# Patient Record
Sex: Male | Born: 1990 | Race: White | Hispanic: No | Marital: Married | Smoking: Current every day smoker
Health system: Southern US, Academic
[De-identification: ages and names within clinical notes are randomized; demographics above are authoritative.]

## PROBLEM LIST (undated history)

## (undated) DIAGNOSIS — B1920 Unspecified viral hepatitis C without hepatic coma: Secondary | ICD-10-CM

## (undated) DIAGNOSIS — F419 Anxiety disorder, unspecified: Secondary | ICD-10-CM

## (undated) DIAGNOSIS — R001 Bradycardia, unspecified: Secondary | ICD-10-CM

## (undated) DIAGNOSIS — D497 Neoplasm of unspecified behavior of endocrine glands and other parts of nervous system: Secondary | ICD-10-CM

## (undated) DIAGNOSIS — F32A Depression, unspecified: Secondary | ICD-10-CM

## (undated) DIAGNOSIS — D496 Neoplasm of unspecified behavior of brain: Secondary | ICD-10-CM

## (undated) DIAGNOSIS — R519 Headache, unspecified: Secondary | ICD-10-CM

## (undated) DIAGNOSIS — R569 Unspecified convulsions: Secondary | ICD-10-CM

## (undated) DIAGNOSIS — F17200 Nicotine dependence, unspecified, uncomplicated: Secondary | ICD-10-CM

## (undated) DIAGNOSIS — F1911 Other psychoactive substance abuse, in remission: Secondary | ICD-10-CM

## (undated) HISTORY — PX: BRAIN SURGERY: SHX531

## (undated) HISTORY — DX: Unspecified convulsions: R56.9

## (undated) HISTORY — PX: HX OTHER: 2100001105

## (undated) HISTORY — DX: Bradycardia, unspecified: R00.1

## (undated) HISTORY — PX: HX APPENDECTOMY: SHX54

## (undated) HISTORY — PX: EYE SURGERY: SHX253

## (undated) HISTORY — DX: Unspecified viral hepatitis C without hepatic coma: B19.20
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
## (undated) NOTE — Discharge Instr - Diet (Signed)
Formatting of this note might be different from the original.  Regular.  Electronically signed by Claudie Fisherman, LPN at 95/62/1308 10:56 AM EDT

## (undated) NOTE — Group Note (Signed)
Formatting of this note is different from the original.    Patient Name: [name] [name]  Referring MD: No ref. provider found    Preferred Name: [place] Provider: No name on file   Patient MRN / CSN: [id number] / [id number] Patient Age/ DOB: 66 year old / [birth year]     Date: 11/04/2022  Start: 1000  End: 1100    Number of Participants: 7  Topics discussed: Community,Goals,Thought for the day,Games  Summary: daily goals,(people who aren't  happy with themselves are mean to others)win loose or draw    Level of Participation: did not attend    Diagnosis:   1. Seizure disorder (HCC)    2. Chronic GERD    3. Psychosis, unspecified psychosis type (HCC)    4. Tobacco use disorder    5. Insomnia, unspecified type        Fransico Him, 11/04/2022 11:21 AM      Electronically signed by Karie Schwalbe F at 11/04/2022 11:22 AM EDT

## (undated) NOTE — Discharge Instr - Activity (Signed)
Formatting of this note might be different from the original.  Activity as tolerated.   Electronically signed by Claudie Fisherman, LPN at 16/01/9603 10:56 AM EDT

## (undated) NOTE — Unmapped External Note (Signed)
Formatting of this note might be different from the original.    Problem: Pain  Goal: Patient's pain/discomfort is manageable  Description: Assess and monitor patient's pain using appropriate pain scale. Collaborate with interdisciplinary team and initiate plan and interventions as ordered. Re-assess patient's pain level 30 - 60 minutes after pain management intervention.   Outcome: Progressing    Problem: Safety  Goal: Patient will be injury free during hospitalization  Description: Assess and monitor vitals signs, neurological status including level of consciousness and orientation. Assess patient's risk for falls and implement fall prevention plan of care and interventions per hospital policy.     Ensure arm band on, uncluttered walking paths in room, adequate room lighting, call light and overbed table within reach, bed in low position, wheels locked, side rails up per policy, and non-skid footwear provided.   Outcome: Progressing    Problem: Daily Care  Goal: Daily care needs are met  Description: Assess and monitor ability to perform self care and identify potential discharge needs.  Outcome: Progressing    Problem: Psychosocial Needs  Goal: Demonstrates ability to cope with hospitalization/illness  Description: Assess and monitor patients ability to cope with his/her illness.  Outcome: Progressing  Goal: Collaborate with patient/family/caregiver to identify patient specific goals for this hospitalization  Outcome: Progressing    Problem: Discharge Barriers  Goal: Patient's discharge needs are met  Description: Collaborate with interdisciplinary team and initiate plans and interventions as needed.   Outcome: Progressing  Goal: Case Management- Patient's discharge needs are met  Outcome: Progressing    Problem: Risk for Elopement  Goal: The patient will not elope from the treatment program  Outcome: Progressing    Problem: Knowledge Deficit  Goal: Patient/family/caregiver demonstrates understanding of disease  process, treatment plan, medications, and discharge instructions  Description: Complete learning assessment and assess knowledge base.  Outcome: Progressing    Problem: Compromised Level of Consciousness  Goal: Level of consciousness is stable or improving  Description: Assess and monitor vital signs, neurological status to include level of consciousness, orientation level, cognition, reflexes, movement of extremities, pupil size and response to light, neuro checks as ordered, intake and output, tests (EEG), and labs.  Assess and monitor seizure activity and postictal state, if applicable.  Administer anticonvulsants and antipyretics as ordered.  Collaborate with interdisciplinary team to initiate plan and implement interventions as ordered.  Outcome: Progressing    Problem: Potential for Injury  Goal: Patient will remain free of physical injury  Description: Assess and monitor vital signs, neurological status including level of consciousness and orientation, skin, mobility, nutritional status, and labs.  Assess patient's risk for falls on admission and per hospital policy.  Collaborate with interdisciplinary team to initiate plan and implement interventions as ordered.  Outcome: Progressing    Problem: Inadequate Breathing Pattern  Goal: Patient's breathing pattern will be adequate to maintain effective ventilation  Description: Assess and monitor vital signs, respiratory status (to include depth, effort, breath sounds, and ability to cough), oxygen saturation, cyanosis, skin color, muscle tone, tests (CXR) and labs (ABGs, WBCs).  Monitor for apneic episodes, if applicable.  Administer methylxanthines, bronchodilators, steroids, antibiotics, and antipyretics as ordered.  Collaborate with respiratory therapy to administer medications and treatments.    Outcome: Progressing    Electronically signed by Alean Rinne, RN at 11/03/2022  7:56 AM EDT

## (undated) NOTE — Group Note (Signed)
Formatting of this note is different from the original.    Patient Name: [name] [name]  Referring MD: No ref. provider found    Preferred Name: [name] Provider: No name on file   Patient MRN / CSN: [id number] / [id number] Patient Age/ DOB: 21 year old / May 17, 1990     Date: 11/02/2022  Start: 1230  End: 1325    Number of Participants: 8  Topics discussed: Exercise,Music,Crafts  Summary: sittercise,music from the radio,wood crafts    Level of Participation: did not attend    Diagnosis: No diagnosis found.     Fransico Him, 11/02/2022 2:02 PM      Electronically signed by Karie Schwalbe F at 11/02/2022  2:02 PM EDT

## (undated) NOTE — Nursing Note (Signed)
Formatting of this note might be different from the original.  Patient has slept for approximately 9 hours. Patient did not report any needs during the night. Patient is currently still in bed resting calmly and quietly. No distress noted. Respiratory rate and rhythm are regular and normal. Patient encouraged to report to staff with needs or concerns.Will continue to monitor with Q 15 minute level III safety checks and report to oncoming shift.   Electronically signed by Shirlee More, RN at 11/02/2022  5:39 AM EDT

## (undated) NOTE — Nursing Note (Signed)
Formatting of this note might be different from the original.  Patient alert and oriented x4. Out in day room interacting with peers and staff appropriately. Patient has been labile at times. Patient cooperative with assessment and medication administration. Patient denies anxiety and depression. Denies SI,HI, and all hallucinations.  Contracts for safety. Denies any further needs. Will continue to monitor. Remains on level three observations. I have reviewed on fall prevention POC. Emphasized "call, don't fall" policy, use of non-skid footwear when OOB and the importance of maintaining the floor free of clutter. The patient verbalizes understanding of fall precautions. I have reviewed the following with the patient to ensure safety during the hospital stay: the importance of always wearing non-slip socks, alerting staff when assistance is needed ambulating, and keeping the floor free of clutter and spills. Pt verbalizes understanding and in agreement with plan of care. Nonskid footwear provided and encouraged patient to utilize.    Electronically signed by Adriana Simas, RN at 11/01/2022 11:54 AM EDT

## (undated) NOTE — Unmapped External Note (Signed)
Formatting of this note might be different from the original.    Problem: Pain  Goal: Patient's pain/discomfort is manageable  Description: Assess and monitor patient's pain using appropriate pain scale. Collaborate with interdisciplinary team and initiate plan and interventions as ordered. Re-assess patient's pain level 30 - 60 minutes after pain management intervention.   Outcome: Progressing    Problem: Safety  Goal: Patient will be injury free during hospitalization  Description: Assess and monitor vitals signs, neurological status including level of consciousness and orientation. Assess patient's risk for falls and implement fall prevention plan of care and interventions per hospital policy.     Ensure arm band on, uncluttered walking paths in room, adequate room lighting, call light and overbed table within reach, bed in low position, wheels locked, side rails up per policy, and non-skid footwear provided.   Outcome: Progressing    Problem: Daily Care  Goal: Daily care needs are met  Description: Assess and monitor ability to perform self care and identify potential discharge needs.  Outcome: Progressing    Problem: Psychosocial Needs  Goal: Demonstrates ability to cope with hospitalization/illness  Description: Assess and monitor patients ability to cope with his/her illness.  Outcome: Progressing  Goal: Collaborate with patient/family/caregiver to identify patient specific goals for this hospitalization  Outcome: Progressing    Problem: Discharge Barriers  Goal: Patient's discharge needs are met  Description: Collaborate with interdisciplinary team and initiate plans and interventions as needed.   Outcome: Progressing  Goal: Case Management- Patient's discharge needs are met  Outcome: Progressing    Problem: Risk for Elopement  Goal: The patient will not elope from the treatment program  Outcome: Progressing    Problem: Knowledge Deficit  Goal: Patient/family/caregiver demonstrates understanding of disease  process, treatment plan, medications, and discharge instructions  Description: Complete learning assessment and assess knowledge base.  Outcome: Progressing    Problem: Compromised Level of Consciousness  Goal: Level of consciousness is stable or improving  Description: Assess and monitor vital signs, neurological status to include level of consciousness, orientation level, cognition, reflexes, movement of extremities, pupil size and response to light, neuro checks as ordered, intake and output, tests (EEG), and labs.  Assess and monitor seizure activity and postictal state, if applicable.  Administer anticonvulsants and antipyretics as ordered.  Collaborate with interdisciplinary team to initiate plan and implement interventions as ordered.  Outcome: Progressing    Problem: Potential for Injury  Goal: Patient will remain free of physical injury  Description: Assess and monitor vital signs, neurological status including level of consciousness and orientation, skin, mobility, nutritional status, and labs.  Assess patient's risk for falls on admission and per hospital policy.  Collaborate with interdisciplinary team to initiate plan and implement interventions as ordered.  Outcome: Progressing    Problem: Inadequate Breathing Pattern  Goal: Patient's breathing pattern will be adequate to maintain effective ventilation  Description: Assess and monitor vital signs, respiratory status (to include depth, effort, breath sounds, and ability to cough), oxygen saturation, cyanosis, skin color, muscle tone, tests (CXR) and labs (ABGs, WBCs).  Monitor for apneic episodes, if applicable.  Administer methylxanthines, bronchodilators, steroids, antibiotics, and antipyretics as ordered.  Collaborate with respiratory therapy to administer medications and treatments.    Outcome: Progressing    Electronically signed by Shirlee More, RN at 10/31/2022  4:45 AM EDT
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
## (undated) NOTE — Nursing Note (Signed)
Formatting of this note might be different from the original.  Patient has slept for approximately 8 hours. Patient did not report any needs during the night. Patient is currently still in bed resting calmly and quietly. No distress noted. Respiratory rate and rhythm are regular and normal. Patient encouraged to report to staff with needs or concerns.Will continue to monitor with Q 15 minute level III safety checks and report to oncoming shift.   Electronically signed by Shirlee More, RN at 11/01/2022  6:31 AM EDT

## (undated) NOTE — Nursing Note (Signed)
Formatting of this note might be different from the original.  Patient has had no major changes this shift. Compliant with treatment. Denies SI/HI. Contracts for safety. Makes needs known. Denies any needs at this time. Remains on safety level 3 .  Electronically signed by Adriana Simas, RN at 11/01/2022  5:31 PM EDT

## (undated) NOTE — Unmapped External Note (Signed)
Formatting of this note might be different from the original.    Problem: Pain  Goal: Patient's pain/discomfort is manageable  Description: Assess and monitor patient's pain using appropriate pain scale. Collaborate with interdisciplinary team and initiate plan and interventions as ordered. Re-assess patient's pain level 30 - 60 minutes after pain management intervention.   Outcome: Progressing    Electronically signed by Christophe Louis, RN at 11/04/2022 11:18 AM EDT

## (undated) NOTE — Nursing Note (Signed)
Formatting of this note might be different from the original.  Patient has slept for approximately 9 hours. Patient did not report any needs during the night. Patient is currently in bed resting. No distress noted. Respirations are regular and unlabored. Patient encouraged to report any needs or concerns to staff. Will continue to monitor with Q 15 minute level III safety checks.  Electronically signed by Nicola Police, RN at 11/03/2022  6:00 AM EDT

## (undated) NOTE — Group Note (Signed)
Formatting of this note is different from the original.    Patient Name: [name] [name]  Referring MD: No ref. provider found    Preferred Name: [name] Provider: No name on file   Patient MRN / CSN: [id number] / [id number] Patient Age/ DOB: 59 year old / 25-Feb-1991     Date: 11/04/2022  Start: 1315  End: 1345    Number of Participants: 4  Topics discussed: Resentment  Summary: Patients defined and described resentment and its negative physical and emotional effects on the body.  Patients used the metaphor of an iceberg to identify ways that resentment manifests outwardly and the underlying feelings and triggers that may lie "beneath the surface."  Patient described situations in which they felt resentful and identified ways to cope with and process those feelings.      Level of Participation: Patient did not attend group.    Diagnosis: No diagnosis found.     Ruben Im, MSW, 11/04/2022 8:23 AM      Electronically signed by Ruben Im, MSW at 11/04/2022  8:23 AM EDT

## (undated) NOTE — Unmapped External Note (Signed)
Formatting of this note might be different from the original.    Problem: Pain  Goal: Patient's pain/discomfort is manageable  Description: Assess and monitor patient's pain using appropriate pain scale. Collaborate with interdisciplinary team and initiate plan and interventions as ordered. Re-assess patient's pain level 30 - 60 minutes after pain management intervention.   Outcome: Progressing    Electronically signed by Casimiro Needle, RN at 11/03/2022  7:38 PM EDT
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
## (undated) NOTE — Unmapped External Note (Signed)
Formatting of this note might be different from the original.    Problem: Pain  Goal: Patient's pain/discomfort is manageable  Description: Assess and monitor patient's pain using appropriate pain scale. Collaborate with interdisciplinary team and initiate plan and interventions as ordered. Re-assess patient's pain level 30 - 60 minutes after pain management intervention.   Outcome: Progressing    Problem: Safety  Goal: Patient will be injury free during hospitalization  Description: Assess and monitor vitals signs, neurological status including level of consciousness and orientation. Assess patient's risk for falls and implement fall prevention plan of care and interventions per hospital policy.     Ensure arm band on, uncluttered walking paths in room, adequate room lighting, call light and overbed table within reach, bed in low position, wheels locked, side rails up per policy, and non-skid footwear provided.   Outcome: Progressing    Problem: Daily Care  Goal: Daily care needs are met  Description: Assess and monitor ability to perform self care and identify potential discharge needs.  Outcome: Progressing    Problem: Psychosocial Needs  Goal: Demonstrates ability to cope with hospitalization/illness  Description: Assess and monitor patients ability to cope with his/her illness.  Outcome: Progressing  Goal: Collaborate with patient/family/caregiver to identify patient specific goals for this hospitalization  Outcome: Progressing    Electronically signed by Nicola Police, RN at 11/02/2022  7:14 PM EDT

## (undated) NOTE — Group Note (Signed)
Formatting of this note is different from the original.    Patient Name: [name] [name]  Referring MD: No ref. provider found    Preferred Name: [name] Provider: No name on file   Patient MRN / CSN: [id number] / [id number] Patient Age/ DOB: 39 year old / 1991-03-11     Date: 11/03/2022  Start: 1230  End: 1310    Number of Participants: 5  Topics discussed: Games  Summary: scattergories    Level of Participation: did not attend    Diagnosis: No diagnosis found.     Fransico Him, 11/03/2022 1:26 PM      Electronically signed by Karie Schwalbe F at 11/03/2022  1:26 PM EDT

## (undated) NOTE — Progress Notes (Signed)
Formatting of this note might be different from the original.  Patient admitted to [facility] has insomnia and had slept in days patient was sleeping pt dont want consult at this time.  We will check out to day shift  Electronically signed by Rodell Perna, DO at 10/31/2022  5:53 AM EDT

## (undated) NOTE — Progress Notes (Signed)
Formatting of this note might be different from the original.  Spoke with patient's mother who states  that she does not thik patient is ready for discharge she said that there has been an ncrease n his aggression and is not compliant with his treatment. My stated that the court order his TDO and if he is not cooperative with treatment he will go to jail  Electronically signed by Laury Deep, Mental Health Therapist at 11/01/2022  5:38 PM EDT

## (undated) NOTE — Nursing Note (Signed)
Formatting of this note might be different from the original.  Patient has been out pacing the hall interacting appropriately with peers this shift. Patient reports he is hoping he can go home tomorrow. 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 Patient is alert and oriented x 4. The patient denies any current emotional pain at this time. The patient denies any current SI and HI. The patient contracts for safety. Patient denies any current AVT hallucinations. The patient's affect is labile. Currently the patient is in bed resting calmly and quietly at this time. The patient rates physical pain as a 7 on a scale of 0-10 that he describes as pain due to the brain tumor he has. Patient given Tylenol 650 mg PRN and Robaxin 750 mg PRN per patient request for pain. No distress noted. Respiratory rate and rhythm normal and unlabored. Patient encouraged to express emotions and feelings to staff. Patient instructed to report to staff with questions, concerns, or needs. I have reviewed the fall prevention plan of care. Patient has been educated on the importance of keeping their room free of clutter to prevent falls. Patient encouraged to wear non-slip fall prevention socks at all times when out of bed. Patient instructed to call for assistance with ambulation if needed. Will continue to monitor with Q 15 minute level III safety checks.    Electronically signed by Shirlee More, RN at 11/01/2022  9:47 PM EDT

## (undated) NOTE — Nursing Note (Signed)
Formatting of this note might be different from the original.  Patient alert and oriented x3. Not oriented to time. Isolative to room interactions with staff appropriate. Labile mood and affect noted. Patient cooperative with assessment and medication administration. Denies SI,HI, and all hallucinations. Contracts for safety. Patient reports he knows that he has delusional and paranoid thoughts. Denies any further needs. Will continue to monitor. Remains on level three observations.  I have reviewed on fall prevention POC. Emphasized "call, don't fall" policy, use of non-skid footwear when OOB and the importance of maintaining the floor free of clutter. The patient verbalizes understanding of fall precautions. I have reviewed the following with the patient to ensure safety during the hospital stay: the importance of always wearing non-slip socks, alerting staff when assistance is needed ambulating, and keeping the floor free of clutter and spills. Pt verbalizes understanding and in agreement with plan of care. Nonskid footwear provided and encouraged patient to utilize.   Electronically signed by Adriana Simas, RN at 10/31/2022  1:04 PM EDT

## (undated) NOTE — Nursing Note (Signed)
Formatting of this note might be different from the original.  Patient has slept for approximately 3 hours since admission. Patient did not report any needs during the night after going to bed. Patient is currently still in bed resting calmly and quietly. No distress noted. Respiratory rate and rhythm are regular and normal. Patient encouraged to report to staff with needs or concerns.Will continue to monitor with Q 15 minute level III safety checks and report to oncoming shift.   Electronically signed by Shirlee More, RN at 10/31/2022  5:55 AM EDT

## (undated) NOTE — Nursing Note (Signed)
Formatting of this note might be different from the original.  Up for am medications.  Affect and mood appropriate.  Denies any depression, anxiety or SI.  Hopes to go home past TDO hearing. Slept well. Encouraged to wear non slip socks to prevent falls. Level 3 safety checks continue.   Electronically signed by Christophe Louis, RN at 11/02/2022  7:35 AM EDT

## (undated) NOTE — Nursing Note (Signed)
Formatting of this note might be different from the original.  GNFA:[id number]  X/B:[id number]  EXP:10/2024  [id number]     UZEDY 200MG /0.56ML given in left subcutaneous tissue. Pt tolerated well. Will continue to monitor.  Electronically signed by Adriana Simas, RN at 11/01/2022  1:19 PM EDT

## (undated) NOTE — Group Note (Signed)
Formatting of this note is different from the original.    Patient Name: [name] [name]  Referring MD: No ref. provider found    Preferred Name: [name] Provider: No name on file   Patient MRN / CSN: [id number] / [id number] Patient Age/ DOB: 55 year old / 09/15/1990     Date: 11/03/2022  Start: 0930  End: 1025    Number of Participants: 6  Topics discussed: Community,Goals,Thought for the day,Mind over matter,Self awareness  Summary: daily goals,(one of the happiest moments in life is when you find the courage to let go of what you can't change)trick questions,positive comments from peers    Level of Participation: active  Quality of Participation: attentive  Interactions with Others: gave feedback  Mood/Affect: appropriate  Triggers (if applicable):   Cognition: coherent/clear  Progress: Minimal  Response: goal"go home eat good"  Plan: patient will be encouraged to attend and participate in daily activity groups     Diagnosis: No diagnosis found.     Fransico Him, 11/03/2022 10:56 AM      Electronically signed by Karie Schwalbe F at 11/03/2022 10:57 AM EDT

## (undated) NOTE — Nursing Note (Signed)
Formatting of this note might be different from the original.  Pt is isolating to self this evening, no noted distress  Electronically signed by Joaquim Nam, RN at 11/02/2022  5:57 PM EDT

## (undated) NOTE — Progress Notes (Signed)
Formatting of this note might be different from the original.  Reviewed therapy notes.   Cherub Gaylyn Rong, Mental Health Therapist  LCSW  Electronically signed by Selinda Eon, Mental Health Therapist at 11/04/2022 11:10 AM EDT

## (undated) NOTE — Unmapped External Note (Signed)
Formatting of this note might be different from the original.    Problem: Pain  Goal: Patient's pain/discomfort is manageable  Description: Assess and monitor patient's pain using appropriate pain scale. Collaborate with interdisciplinary team and initiate plan and interventions as ordered. Re-assess patient's pain level 30 - 60 minutes after pain management intervention.   Outcome: Progressing    Problem: Safety  Goal: Patient will be injury free during hospitalization  Description: Assess and monitor vitals signs, neurological status including level of consciousness and orientation. Assess patient's risk for falls and implement fall prevention plan of care and interventions per hospital policy.     Ensure arm band on, uncluttered walking paths in room, adequate room lighting, call light and overbed table within reach, bed in low position, wheels locked, side rails up per policy, and non-skid footwear provided.   Outcome: Progressing    Problem: Daily Care  Goal: Daily care needs are met  Description: Assess and monitor ability to perform self care and identify potential discharge needs.  Outcome: Progressing    Problem: Psychosocial Needs  Goal: Demonstrates ability to cope with hospitalization/illness  Description: Assess and monitor patients ability to cope with his/her illness.  Outcome: Progressing  Goal: Collaborate with patient/family/caregiver to identify patient specific goals for this hospitalization  Outcome: Progressing    Problem: Discharge Barriers  Goal: Patient's discharge needs are met  Description: Collaborate with interdisciplinary team and initiate plans and interventions as needed.   Outcome: Progressing  Goal: Case Management- Patient's discharge needs are met  Outcome: Progressing    Problem: Risk for Elopement  Goal: The patient will not elope from the treatment program  Outcome: Progressing    Problem: Knowledge Deficit  Goal: Patient/family/caregiver demonstrates understanding of disease  process, treatment plan, medications, and discharge instructions  Description: Complete learning assessment and assess knowledge base.  Outcome: Progressing    Problem: Compromised Level of Consciousness  Goal: Level of consciousness is stable or improving  Description: Assess and monitor vital signs, neurological status to include level of consciousness, orientation level, cognition, reflexes, movement of extremities, pupil size and response to light, neuro checks as ordered, intake and output, tests (EEG), and labs.  Assess and monitor seizure activity and postictal state, if applicable.  Administer anticonvulsants and antipyretics as ordered.  Collaborate with interdisciplinary team to initiate plan and implement interventions as ordered.  Outcome: Progressing    Problem: Potential for Injury  Goal: Patient will remain free of physical injury  Description: Assess and monitor vital signs, neurological status including level of consciousness and orientation, skin, mobility, nutritional status, and labs.  Assess patient's risk for falls on admission and per hospital policy.  Collaborate with interdisciplinary team to initiate plan and implement interventions as ordered.  Outcome: Progressing    Problem: Inadequate Breathing Pattern  Goal: Patient's breathing pattern will be adequate to maintain effective ventilation  Description: Assess and monitor vital signs, respiratory status (to include depth, effort, breath sounds, and ability to cough), oxygen saturation, cyanosis, skin color, muscle tone, tests (CXR) and labs (ABGs, WBCs).  Monitor for apneic episodes, if applicable.  Administer methylxanthines, bronchodilators, steroids, antibiotics, and antipyretics as ordered.  Collaborate with respiratory therapy to administer medications and treatments.    Outcome: Progressing    Electronically signed by Shirlee More, RN at 11/01/2022  9:48 PM EDT

## (undated) NOTE — Nursing Note (Signed)
Formatting of this note might be different from the original.  Patient is alert and oriented x 4. Mood is calm and cooperative; affect is appropriate. He denies having any nightmares last night but states that he did not sleep very good. He denies having any physical pain. He denies any SI, HI, or A,V,T hallucinations. He denies any anxiety or depression. He contracts for safety and remains a level III. He will continue to be monitored every 15 minutes for safety.  Electronically signed by Alean Rinne, RN at 11/03/2022  9:10 AM EDT

## (undated) NOTE — Unmapped External Note (Signed)
Formatting of this note might be different from the original.    Problem: Pain  Goal: Patient's pain/discomfort is manageable  Description: Assess and monitor patient's pain using appropriate pain scale. Collaborate with interdisciplinary team and initiate plan and interventions as ordered. Re-assess patient's pain level 30 - 60 minutes after pain management intervention.   Outcome: Progressing    Problem: Safety  Goal: Patient will be injury free during hospitalization  Description: Assess and monitor vitals signs, neurological status including level of consciousness and orientation. Assess patient's risk for falls and implement fall prevention plan of care and interventions per hospital policy.     Ensure arm band on, uncluttered walking paths in room, adequate room lighting, call light and overbed table within reach, bed in low position, wheels locked, side rails up per policy, and non-skid footwear provided.   Outcome: Progressing    Problem: Daily Care  Goal: Daily care needs are met  Description: Assess and monitor ability to perform self care and identify potential discharge needs.  Outcome: Progressing    Problem: Psychosocial Needs  Goal: Demonstrates ability to cope with hospitalization/illness  Description: Assess and monitor patients ability to cope with his/her illness.  Outcome: Progressing  Goal: Collaborate with patient/family/caregiver to identify patient specific goals for this hospitalization  Outcome: Progressing    Problem: Discharge Barriers  Goal: Patient's discharge needs are met  Description: Collaborate with interdisciplinary team and initiate plans and interventions as needed.   Outcome: Progressing  Goal: Case Management- Patient's discharge needs are met  Outcome: Progressing    Problem: Risk for Elopement  Goal: The patient will not elope from the treatment program  Outcome: Progressing    Problem: Knowledge Deficit  Goal: Patient/family/caregiver demonstrates understanding of disease  process, treatment plan, medications, and discharge instructions  Description: Complete learning assessment and assess knowledge base.  Outcome: Progressing    Problem: Compromised Level of Consciousness  Goal: Level of consciousness is stable or improving  Description: Assess and monitor vital signs, neurological status to include level of consciousness, orientation level, cognition, reflexes, movement of extremities, pupil size and response to light, neuro checks as ordered, intake and output, tests (EEG), and labs.  Assess and monitor seizure activity and postictal state, if applicable.  Administer anticonvulsants and antipyretics as ordered.  Collaborate with interdisciplinary team to initiate plan and implement interventions as ordered.  Outcome: Progressing    Problem: Potential for Injury  Goal: Patient will remain free of physical injury  Description: Assess and monitor vital signs, neurological status including level of consciousness and orientation, skin, mobility, nutritional status, and labs.  Assess patient's risk for falls on admission and per hospital policy.  Collaborate with interdisciplinary team to initiate plan and implement interventions as ordered.  Outcome: Progressing    Problem: Inadequate Breathing Pattern  Goal: Patient's breathing pattern will be adequate to maintain effective ventilation  Description: Assess and monitor vital signs, respiratory status (to include depth, effort, breath sounds, and ability to cough), oxygen saturation, cyanosis, skin color, muscle tone, tests (CXR) and labs (ABGs, WBCs).  Monitor for apneic episodes, if applicable.  Administer methylxanthines, bronchodilators, steroids, antibiotics, and antipyretics as ordered.  Collaborate with respiratory therapy to administer medications and treatments.    Outcome: Progressing    Electronically signed by Shirlee More, RN at 10/31/2022 10:17 PM EDT

## (undated) NOTE — Group Note (Signed)
Formatting of this note is different from the original.    Patient Name: [name] [name]  Referring MD: No ref. provider found    Preferred Name: [name] Provider: No name on file   Patient MRN / CSN: [id number] / [id number] Patient Age/ DOB: 54 year old / 26-Jul-1990     Date: 11/02/2022  Start: 0915  End: 1005    Number of Participants: 11  Topics discussed: Community,Goals,Thought for the day,Table poetry,Games  Summary: daily goals,(the sign of a beautiful person is that they always see beauty in others)wooden block words,hedbanz    Level of Participation: did not attend    Diagnosis: No diagnosis found.     Fransico Him, 11/02/2022 10:59 AM      Electronically signed by Karie Schwalbe F at 11/02/2022 10:59 AM EDT

## (undated) NOTE — Progress Notes (Signed)
Formatting of this note might be different from the original.  Reviewed therapy notes and treatment plan.  Cherub Gaylyn Rong, Mental Health Therapist  LCSW  Electronically signed by Selinda Eon, Mental Health Therapist at 11/03/2022  1:55 PM EDT

## (undated) NOTE — Group Note (Signed)
Formatting of this note is different from the original.    Patient Name: [name]  Referring MD: No ref. provider found    Preferred Name: Brylen Provider: No name on file   Patient MRN / CSN: [id number] / [id number] Patient Age/ DOB: 36 year old / [birth year]     Date: 11/03/2022  Start: 1350  End: 1430    Number of Participants: 5  Topics discussed: Emotions  Summary: Participants discussed their emotions and how they can handle them.    Level of Participation: did not attend  Quality of Participation:   Interactions with Others:   Mood/Affect:   Triggers (if applicable):   Cognition:   Progress:   Response:   Plan:     Diagnosis: No diagnosis found.     Leeanne Deed, 11/04/2022 8:23 AM      Electronically signed by Alanson Puls B at 11/04/2022  8:23 AM EDT

## (undated) NOTE — Unmapped External Note (Signed)
Formatting of this note might be different from the original.    Problem: Pain  Goal: Patient's pain/discomfort is manageable  Description: Assess and monitor patient's pain using appropriate pain scale. Collaborate with interdisciplinary team and initiate plan and interventions as ordered. Re-assess patient's pain level 30 - 60 minutes after pain management intervention.   Outcome: Progressing    Electronically signed by Christophe Louis, RN at 11/02/2022  7:14 AM EDT

## (undated) NOTE — Nursing Note (Signed)
Formatting of this note might be different from the original.  Patient has had no major changes this shift. Compliant with treatment. Remains delusional,labile, grandiose, and paranoid. Denies SI/HI. Contracts for safety. Makes needs known. Denies any needs at this time. Remains on safety level 3 .  Electronically signed by Adriana Simas, RN at 10/31/2022  5:06 PM EDT

## (undated) NOTE — Unmapped External Note (Signed)
Formatting of this note might be different from the original.    Problem: Pain  Goal: Patient's pain/discomfort is manageable  Description: Assess and monitor patient's pain using appropriate pain scale. Collaborate with interdisciplinary team and initiate plan and interventions as ordered. Re-assess patient's pain level 30 - 60 minutes after pain management intervention.   Outcome: Progressing    Problem: Safety  Goal: Patient will be injury free during hospitalization  Description: Assess and monitor vitals signs, neurological status including level of consciousness and orientation. Assess patient's risk for falls and implement fall prevention plan of care and interventions per hospital policy.     Ensure arm band on, uncluttered walking paths in room, adequate room lighting, call light and overbed table within reach, bed in low position, wheels locked, side rails up per policy, and non-skid footwear provided.   Outcome: Progressing    Problem: Daily Care  Goal: Daily care needs are met  Description: Assess and monitor ability to perform self care and identify potential discharge needs.  Outcome: Progressing    Problem: Psychosocial Needs  Goal: Demonstrates ability to cope with hospitalization/illness  Description: Assess and monitor patients ability to cope with his/her illness.  Outcome: Progressing    Problem: Risk for Elopement  Goal: The patient will not elope from the treatment program  Outcome: Progressing    Problem: Knowledge Deficit  Goal: Patient/family/caregiver demonstrates understanding of disease process, treatment plan, medications, and discharge instructions  Description: Complete learning assessment and assess knowledge base.  Outcome: Progressing    Problem: Compromised Level of Consciousness  Goal: Level of consciousness is stable or improving  Description: Assess and monitor vital signs, neurological status to include level of consciousness, orientation level, cognition, reflexes, movement of  extremities, pupil size and response to light, neuro checks as ordered, intake and output, tests (EEG), and labs.  Assess and monitor seizure activity and postictal state, if applicable.  Administer anticonvulsants and antipyretics as ordered.  Collaborate with interdisciplinary team to initiate plan and implement interventions as ordered.  Outcome: Progressing    Problem: Potential for Injury  Goal: Patient will remain free of physical injury  Description: Assess and monitor vital signs, neurological status including level of consciousness and orientation, skin, mobility, nutritional status, and labs.  Assess patient's risk for falls on admission and per hospital policy.  Collaborate with interdisciplinary team to initiate plan and implement interventions as ordered.  Outcome: Progressing    Problem: Inadequate Breathing Pattern  Goal: Patient's breathing pattern will be adequate to maintain effective ventilation  Description: Assess and monitor vital signs, respiratory status (to include depth, effort, breath sounds, and ability to cough), oxygen saturation, cyanosis, skin color, muscle tone, tests (CXR) and labs (ABGs, WBCs).  Monitor for apneic episodes, if applicable.  Administer methylxanthines, bronchodilators, steroids, antibiotics, and antipyretics as ordered.  Collaborate with respiratory therapy to administer medications and treatments.    Outcome: Progressing    Electronically signed by Adriana Simas, RN at 10/31/2022 11:59 AM EDT

## (undated) NOTE — Group Note (Signed)
Formatting of this note is different from the original.    Patient Name: [name] [name]  Referring MD: No ref. provider found    Preferred Name: [name] Provider: No name on file   Patient MRN / CSN: [id number] / [id number] Patient Age/ DOB: 80 year old / 30-Dec-1990     Date: 11/02/2022  Start: 1105  End: 1145    Number of Participants: 9  Topics discussed: [employer]s and Self-Soothing  Summary: Participants took part in a group discussion about coping skills and self-soothing.    Level of Participation: active  Quality of Participation: cooperative  Interactions with Others: gave feedback  Mood/Affect: appropriate  Triggers (if applicable):   Cognition: coherent/clear  Progress: Moderate  Response:   Plan: patient will be encouraged to attend groups daily    Diagnosis: No diagnosis found.     Leeanne Deed, 11/02/2022 11:59 AM      Electronically signed by Alanson Puls B at 11/02/2022 11:59 AM EDT

## (undated) NOTE — Group Note (Signed)
Formatting of this note is different from the original.    Patient Name: [name] [name]  Referring MD: No ref. provider found    Preferred Name: Pilar Provider: No name on file   Patient MRN / CSN: [id number] / [id number] Patient Age/ DOB: 71 year old / July 17, 1990     Date: 10/31/2022  Start: 1300  End: 1400    Number of Participants: 7  Topics discussed: sel discovery    Summary: patient's participate in activity discussing different aspects of self awarness     Ptient did not attend group  Diagnosis: No diagnosis found.     Laury Deep, Mental Health Therapist, 10/31/2022 5:43 PM      Electronically signed by Laury Deep, Mental Health Therapist at 10/31/2022  5:44 PM EDT

## (undated) NOTE — Unmapped External Note (Signed)
Formatting of this note might be different from the original.    Problem: Pain  Goal: Patient's pain/discomfort is manageable  Description: Assess and monitor patient's pain using appropriate pain scale. Collaborate with interdisciplinary team and initiate plan and interventions as ordered. Re-assess patient's pain level 30 - 60 minutes after pain management intervention.   Outcome: Progressing    Problem: Safety  Goal: Patient will be injury free during hospitalization  Description: Assess and monitor vitals signs, neurological status including level of consciousness and orientation. Assess patient's risk for falls and implement fall prevention plan of care and interventions per hospital policy.     Ensure arm band on, uncluttered walking paths in room, adequate room lighting, call light and overbed table within reach, bed in low position, wheels locked, side rails up per policy, and non-skid footwear provided.   Outcome: Progressing    Problem: Daily Care  Goal: Daily care needs are met  Description: Assess and monitor ability to perform self care and identify potential discharge needs.  Outcome: Progressing    Problem: Psychosocial Needs  Goal: Demonstrates ability to cope with hospitalization/illness  Description: Assess and monitor patients ability to cope with his/her illness.  Outcome: Progressing  Goal: Collaborate with patient/family/caregiver to identify patient specific goals for this hospitalization  Outcome: Progressing    Problem: Risk for Elopement  Goal: The patient will not elope from the treatment program  Outcome: Progressing    Problem: Knowledge Deficit  Goal: Patient/family/caregiver demonstrates understanding of disease process, treatment plan, medications, and discharge instructions  Description: Complete learning assessment and assess knowledge base.  Outcome: Progressing    Problem: Compromised Level of Consciousness  Goal: Level of consciousness is stable or improving  Description: Assess  and monitor vital signs, neurological status to include level of consciousness, orientation level, cognition, reflexes, movement of extremities, pupil size and response to light, neuro checks as ordered, intake and output, tests (EEG), and labs.  Assess and monitor seizure activity and postictal state, if applicable.  Administer anticonvulsants and antipyretics as ordered.  Collaborate with interdisciplinary team to initiate plan and implement interventions as ordered.  Outcome: Progressing    Problem: Potential for Injury  Goal: Patient will remain free of physical injury  Description: Assess and monitor vital signs, neurological status including level of consciousness and orientation, skin, mobility, nutritional status, and labs.  Assess patient's risk for falls on admission and per hospital policy.  Collaborate with interdisciplinary team to initiate plan and implement interventions as ordered.  Outcome: Progressing    Problem: Inadequate Breathing Pattern  Goal: Patient's breathing pattern will be adequate to maintain effective ventilation  Description: Assess and monitor vital signs, respiratory status (to include depth, effort, breath sounds, and ability to cough), oxygen saturation, cyanosis, skin color, muscle tone, tests (CXR) and labs (ABGs, WBCs).  Monitor for apneic episodes, if applicable.  Administer methylxanthines, bronchodilators, steroids, antibiotics, and antipyretics as ordered.  Collaborate with respiratory therapy to administer medications and treatments.    Outcome: Progressing    Electronically signed by Adriana Simas, RN at 11/01/2022 11:09 AM EDT

## (undated) NOTE — Nursing Note (Signed)
Formatting of this note might be different from the original.  Notes reviewed.  Agree with assessments  Electronically signed by Christophe Louis, RN at 11/04/2022 11:37 AM EDT

## (undated) NOTE — Group Note (Signed)
Formatting of this note is different from the original.    Patient Name: [name]  Referring MD: No ref. provider found    Preferred Name: [name] [name] Provider: No name on file   Patient MRN / CSN: [id number] / [id number] Patient Age/ DOB: 8 year old / 1990/10/08     Date: 11/02/2022  Start: 1400  End: 1440    Number of Participants: 9  Topics discussed: Change  Summary: Ptient's viewed video "[employer]" patient shared what they gathered from the video and what you need to help wwith change. Patient's discussed what they hd learned that will be useful to help them change     Patient did not attend group    Diagnosis: No diagnosis found.     Laury Deep, Mental Health Therapist, 11/02/2022 5:04 PM      Electronically signed by Laury Deep, Mental Health Therapist at 11/02/2022  5:05 PM EDT

## (undated) NOTE — Nursing Note (Signed)
Formatting of this note might be different from the original.  Medicated with Benadryl 25mg  PO for complaints of allergies.  Electronically signed by Christophe Louis, RN at 11/03/2022  6:16 PM EDT

## (undated) NOTE — Nursing Note (Signed)
Formatting of this note might be different from the original.  Patient isolates to himself. Patient reported he did not want to go to snack with others this shift. Patient is cooperative with assessment and medication administration. Patient denies any SI/HI/AVT hallucinations. Patient denies anxiety and depression. Denies any physical pain. Contracts for safety. Patient instructed to alert staff when assistance is needed. Emphasized the "Call, don't fall" policy, and the importance of wearing nonskid footwear when OOB. Patient verbalized understanding. Floor is free of clutter/spills. Patient reports no needs at this time. Level III q 15 minute safety checks continue.   Electronically signed by Nicola Police, RN at 11/02/2022 10:14 PM EDT

## (undated) NOTE — Nursing Note (Signed)
Formatting of this note might be different from the original.  Client has rested quietly this hs for 9 hours. Continue level 3 q 15 minute safety checks.   Electronically signed by Casimiro Needle, RN at 11/04/2022  6:09 AM EDT

## (undated) NOTE — Nursing Note (Signed)
Formatting of this note might be different from the original.  Patient has been out on the unit with peers pacing the hall and socializing appropriately this shift. Patient remains delusional and tangential with a disorganized thought process and told Hospitalist that he has 2 urethras this evening during H&P. Patient is alert and oriented x 4. The patient denies any current emotional pain att his time. Patient continues to minimize symptoms and fixates on going home Monday. The patient denies any current SI and HI. The patient contracts for safety. Patient denies any current AVT hallucinations. The patient's affect is labile. Currently the patient is in bed trying to rest calmly and quietly at this time. Patient was given Atarax 25 mg PRN for complaint of continued insomnia after Trazodone 50 mg was not effective. The patient rates physical pain as a 5 on a scale of 0-10 in his head on the right side. Patient given Tylenol 650 mg PRN along with Robaxin 750 mg PRN for complaint of pain. No distress noted. Respiratory rate and rhythm normal and unlabored. Patient encouraged to express emotions and feelings to staff. Patient instructed to report to staff with questions, concerns, or needs. I have reviewed the fall prevention plan of care. Patient has been educated on the importance of keeping their room free of clutter to prevent falls. Patient encouraged to wear non-slip fall prevention socks at all times when out of bed. Patient instructed to call for assistance with ambulation if needed. Will continue to monitor with Q 15 minute level III safety checks.    Electronically signed by Shirlee More, RN at 10/31/2022 10:23 PM EDT

## (undated) NOTE — Nursing Note (Signed)
Formatting of this note might be different from the original.  Client has been out of his room this pm watching TV and talking to peers. Client denies suicidal / homicidal ideations, denies a/v hallucinations. Client rates anxiety at 0, depression at 0. Client complaining of a headache rates pain at 4. Client received tylenol 650 mg po for pain. Continue level 3 q 15 minute safety checks.  Electronically signed by Casimiro Needle, RN at 11/03/2022  9:28 PM EDT

---
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
## 2017-06-15 ENCOUNTER — Ambulatory Visit (HOSPITAL_COMMUNITY): Payer: Self-pay

## 2018-01-17 DIAGNOSIS — B1920 Unspecified viral hepatitis C without hepatic coma: Secondary | ICD-10-CM | POA: Insufficient documentation

## 2019-05-15 ENCOUNTER — Encounter (INDEPENDENT_AMBULATORY_CARE_PROVIDER_SITE_OTHER): Payer: Self-pay | Admitting: Neurology

## 2019-08-31 ENCOUNTER — Ambulatory Visit (INDEPENDENT_AMBULATORY_CARE_PROVIDER_SITE_OTHER): Payer: Self-pay

## 2019-08-31 NOTE — Telephone Encounter (Addendum)
-----   Message from Burr Medico sent at 08/29/2019  3:55 PM EDT -----  Pt is calling saying he needs to be seen for seizures and lesions , seizures is for Neurology but there is a referrral for Neurosurgery. I don't know which docotor i can put him with , please give him a call back to get scheduled.   =================================================  Left a message for pt to call back to confirm appointment.  Hamilton Capri, RN  08/31/2019, 16:38

## 2019-09-05 ENCOUNTER — Ambulatory Visit (INDEPENDENT_AMBULATORY_CARE_PROVIDER_SITE_OTHER): Payer: Self-pay | Admitting: Anesthesiology

## 2019-10-17 ENCOUNTER — Other Ambulatory Visit: Payer: Self-pay

## 2019-10-17 ENCOUNTER — Ambulatory Visit: Payer: MEDICAID | Attending: PHYSICIAN ASSISTANT | Admitting: Anesthesiology

## 2019-10-17 ENCOUNTER — Encounter (INDEPENDENT_AMBULATORY_CARE_PROVIDER_SITE_OTHER): Payer: Self-pay | Admitting: Anesthesiology

## 2019-10-17 VITALS — BP 130/78 | HR 60 | Temp 98.7°F | Ht 72.0 in | Wt 166.2 lb

## 2019-10-17 DIAGNOSIS — R569 Unspecified convulsions: Secondary | ICD-10-CM | POA: Insufficient documentation

## 2019-10-17 DIAGNOSIS — G40909 Epilepsy, unspecified, not intractable, without status epilepticus: Secondary | ICD-10-CM

## 2019-10-17 DIAGNOSIS — R930 Abnormal findings on diagnostic imaging of skull and head, not elsewhere classified: Secondary | ICD-10-CM | POA: Insufficient documentation

## 2019-10-17 DIAGNOSIS — R519 Headache, unspecified: Secondary | ICD-10-CM | POA: Insufficient documentation

## 2019-10-17 NOTE — Progress Notes (Signed)
I personally saw and evaluated the patient. See mid-level's note for additional details. My findings/participation are : Pt is a 29 yr old male who had a sudden onset of generalized tonic-clonic seizures in January this year. He has no aura. His wife has not noticed any complex partial seizure like activity. He has had 11-12 seizures since January. He was started on Keppra, but experienced dizziness, mood changes, generalized weakness, nausea and vomiting and stopped this medication. He had a CT head that showed a R temporal hypodense area. I will see him back in three weeks with a MRI brain. 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 I also asked him to talk to his neurologists to get an alternate anti-seizure medication.     Debbora Dus, MD
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
## 2019-11-07 ENCOUNTER — Encounter (INDEPENDENT_AMBULATORY_CARE_PROVIDER_SITE_OTHER): Payer: Self-pay | Admitting: Neurology

## 2019-11-07 ENCOUNTER — Ambulatory Visit (HOSPITAL_BASED_OUTPATIENT_CLINIC_OR_DEPARTMENT_OTHER): Payer: MEDICAID | Admitting: Neurology

## 2019-11-07 ENCOUNTER — Other Ambulatory Visit: Payer: Self-pay

## 2019-11-07 ENCOUNTER — Ambulatory Visit
Admission: RE | Admit: 2019-11-07 | Discharge: 2019-11-07 | Disposition: A | Discharge status: Home / Self Care | Payer: MEDICAID | Source: Ambulatory Visit | Attending: PHYSICIAN ASSISTANT | Admitting: PHYSICIAN ASSISTANT

## 2019-11-07 ENCOUNTER — Encounter (INDEPENDENT_AMBULATORY_CARE_PROVIDER_SITE_OTHER): Payer: Self-pay | Admitting: Anesthesiology

## 2019-11-07 ENCOUNTER — Ambulatory Visit (HOSPITAL_BASED_OUTPATIENT_CLINIC_OR_DEPARTMENT_OTHER): Payer: MEDICAID | Admitting: Anesthesiology

## 2019-11-07 VITALS — BP 140/82 | HR 53 | Ht 72.0 in | Wt 172.0 lb

## 2019-11-07 VITALS — BP 140/82 | HR 53 | Temp 97.8°F | Ht 72.05 in | Wt 172.0 lb

## 2019-11-07 DIAGNOSIS — G930 Cerebral cysts: Secondary | ICD-10-CM

## 2019-11-07 DIAGNOSIS — G939 Disorder of brain, unspecified: Secondary | ICD-10-CM

## 2019-11-07 DIAGNOSIS — R9089 Other abnormal findings on diagnostic imaging of central nervous system: Secondary | ICD-10-CM | POA: Insufficient documentation

## 2019-11-07 DIAGNOSIS — G40909 Epilepsy, unspecified, not intractable, without status epilepticus: Secondary | ICD-10-CM

## 2019-11-07 DIAGNOSIS — R519 Headache, unspecified: Secondary | ICD-10-CM

## 2019-11-07 DIAGNOSIS — R930 Abnormal findings on diagnostic imaging of skull and head, not elsewhere classified: Secondary | ICD-10-CM

## 2019-11-07 DIAGNOSIS — R569 Unspecified convulsions: Secondary | ICD-10-CM

## 2019-11-07 MED ORDER — CARBAMAZEPINE 200 MG TABLET
200.0000 mg | ORAL_TABLET | Freq: Three times a day (TID) | ORAL | 5 refills | Status: DC
Start: 2019-11-07 — End: 2020-03-11

## 2019-11-07 MED ORDER — GADOBUTROL 1 MMOL/ML (604.72 MG/ML) INTRAVENOUS SOLUTION
7.50 mL | INTRAVENOUS | Status: AC
Start: 2019-11-07 — End: 2019-11-07
  Administered 2019-11-07: 7.5 mL via INTRAVENOUS

## 2019-11-07 NOTE — Progress Notes (Signed)
[facility]  Outpatient History and Physical    Date:  11/07/2019  Name: [name] [name]  Age:  29 y.o.  Referring Physician:     Debbora Dus, MD  [address]  [place],  [place] [address]    History of Present Illness  History obtained from:  patient and mother    [name] [name] is a 29 y.o. male who presents with a chief complaint of   Chief Complaint   Patient presents with   . Seizure        The patient reports:       Started sz in Jan 21\    sz's usually in sleep    Bites tongue        R temporal lesion    Was on Keppra 750 BID  Caused chills    Hateful and angry     has R temporal lesion      Past Medical History  Current Outpatient Medications   Medication Sig   . acetaminophen (TYLENOL) 325 mg Oral Tablet Take 325 mg by mouth Every 4 hours as needed for Pain   . carBAMazepine (TEGRETOL) 200 mg Oral Tablet Take 1 Tablet (200 mg total) by mouth Three times a day   . ibuprofen (MOTRIN) 400 mg Oral Tablet Take 400 mg by mouth Four times a day as needed for Pain     No Known Allergies  Past Medical History:   Diagnosis Date   . Bradycardia    . Seizure (CMS [facility])          Past Surgical History:   Procedure Laterality Date   . HX OTHER      calcium deposit removed from above eye as a teen         Family Medical History:     Problem Relation (Age of Onset)    Cancer Maternal Aunt, Paternal Grandfather    Healthy Mother            Social History     Socioeconomic History   . Marital status: Married     Spouse name: Not on file   . Number of children: 0   . Years of education: 65   . Highest education level: Not on file   Occupational History   . Occupation: not employed: formerly worked at [employer]   . Smoking status: Current Every Day Smoker     Packs/day: 1.00     Years: 10.00     Pack years: 10.00     Types: Cigarettes   . Smokeless tobacco: Never Used   Substance and Sexual Activity   . Alcohol use: Not Currently     Comment: occas   Social History Narrative    Right  handed.      Social Determinants of Health     Financial Resource Strain:    . Difficulty of Paying Living Expenses:    Food Insecurity:    . Worried About Charity fundraiser in the Last Year:    . Arboriculturist in the Last Year:    Transportation Needs:    . Film/video editor (Medical):    Marland Kitchen Lack of Transportation (Non-Medical):    Physical Activity:    . Days of Exercise per Week:    . Minutes of Exercise per Session:    Stress:    . Feeling of Stress :    Intimate Partner Violence:    .  Fear of Current or Ex-Partner:    . Emotionally Abused:    Marland Kitchen Physically Abused:    . Sexually Abused:        Review of Systems    Constitutional: negative  Eyes: positive for contacts/glasses  Ears, nose, mouth, throat, and face: negative  Respiratory: negative  Cardiovascular: negative  Gastrointestinal: negative  Genitourinary:negative  Integument/breast: negative  Hematologic/lymphatic: negative  Musculoskeletal:negative  Neurological: see HPI  Behavioral/Psych: positive for depression  Endocrine: negative  Allergic/Immunologic: negative      Examination:    Vitals: BP (!) 140/82   Pulse 53   Ht 1.829 m (6')   Wt 78 kg (171 lb 15.3 oz)   SpO2 98%   BMI 23.32 kg/m       General: appears in good health  Ophthalmoscopic: normal w/o hemorrhages, exudates, or papilledema  Carotids:Carotids normal without bruit  Orientation: Alert and oriented x 3  Memory: Registration, Recall, and Following of commands is normal  Attention: Attention and Concentration are normal  Knowledge: Good  Language: Normal  Speech: Normal  Cranial nerves: Cranial nerves 2-12 are normal  Gait:: Normal  Coordination: Coordination is normal without tremor  Sensory: Sensory exam in the upper and lower extremities is normal  Muscle tone: Upper and lower muscle tone is normal  Motor strength:  Motor strength is normal throughout.  Reflexes: hypoactive    I have reviewed the following: images and internal records    Assessment and Plan    1. Seizures  (CMS HCC)    2. Abnormal brain MRI      Orders Placed This Encounter   . Cbc/Diff   . Ast (Sgot)   . Alt (Sgpt)   . EEG - OUTPATIENT ROUTINE (AWAKE & ASLEEP)   . carBAMazepine (TEGRETOL) 200 mg Oral Tablet         RTC 6 weeks      Adelene Idler, MD 11/07/2019, 11:45

## 2019-11-07 NOTE — Progress Notes (Unsigned)
NEUROSURGERY, PHYSICIAN OFFICE CENTER  [place] [address]  Operated by [facility]  Progress Note    Name: [name] [name] MRN:  [id number]   Date: 11/07/2019 Age: 29 y.o.       Referring Provider:   Theodoro Doing, MD  [address] [place],  [place] [address]      Subjective:   [name] [name] is a 29 year old male returning to clinic for 3 week follow up with MRI brain.   In January 2021 the patient had onset of generalized tonic-clonic seizures (started as nocturnal, would be "grumpy/moody" the day before a spell and moans at the start; develops labored breathing).  In April, they started occurring during the day: went to [facility] ED. A head CT was done which showed a right temporal hypodense area. Keppra was started. Due to side effects (dizziness, mood changes, generalized weakness, nausea, vomiting), he stopped taking the medication.   He does not have an aura prior to a spell. He has experienced left sided tongue biting; no incontinence. Post ictally he has h/a, somnolence, and no recollection of the event. He has daily h/a, dysequilibrium, past memory trouble.   Semiology per mother: stiffness, pulling clothes, tongue biting, and heavy breathing. Reports 3 seizures since last Tuesday.       Current Outpatient Medications   Medication Sig   . acetaminophen (TYLENOL) 325 mg Oral Tablet Take 325 mg by mouth Every 4 hours as needed for Pain   . ibuprofen (MOTRIN) 400 mg Oral Tablet Take 400 mg by mouth Four times a day as needed for Pain     No Known Allergies     Past Medical History:   Diagnosis Date   . Bradycardia    . Seizure (CMS [facility])      Past Surgical History:   Procedure Laterality Date   . HX OTHER      calcium deposit removed from above eye as a teen     ROS:  Reports chills stabbing rib pain. Denies further ROS complaints     Objective:   Vital Signs:  BP (!) 140/82   Pulse 53   Temp 36.6 C (97.8 F) (Thermal Scan)   Ht 1.83 m (6' 0.05")   Wt 78 kg (171 lb 15.3 oz)   SpO2 98%    BMI 23.29 kg/m       Constitutional  General appearance: In no acute distress   Eyes: Ophthalmic exam of optic discs and posterior segments: PERRL, EOM intact   Cardiovascular:   Bradycardic   Lungs CTA   HEENT:    HEENT:  Normal  Musculoskeletal  Gait and Station: : Normal  Muscle strength (upper extremities): : Normal  Muscle strength (lower extremities): : Normal  Muscle tone (upper extremities): : Normal  Muscle tone (lower extremities): : Normal  Sensation: Normal  Coordination: Normal  Hoffman's reflex: Left: negative Right: negative  Ankle clonus:Left : Not present Right Not present    Neurological  Orientation: Normal  Recent and remote memory: poor past memory   Attention span and concentration: follows commands   Language: intelligible   Fund of knowledge: Normal    Cranial Nerves  2nd: Normal  3rd,4th,6th: Normal  5th: Normal  7th: Not done  8th: Normal  9th: Not done  11th: Normal  12th: Not done    Data reviewed  11/07/19 MRI brain w/wo [facility] PACS:  Not officially read,     Discussions with other  providers:   Reviewed chart notes     Assessment:    1. Abnormal head CT    2. Seizures (CMS HCC)    3. Headache      Orders Placed This Encounter   . MRI BRAIN W/WO CONTRAST   . Referral to Neurology       Recommendations:    -Return to clinic in 6 months with MRI brain w/wo   -Refer to Neurology for seizures     Cheral Marker, APRN,FNP-BC  As a shared visit with Dr. Margart Sickles

## 2019-11-07 NOTE — Progress Notes (Unsigned)
I personally saw and evaluated the patient. See mid-level's note for additional details. My findings/participation are : Pt has a recent onset of GTC seizures. 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 This may be worth a surgical resection if this is thought to be the site of his seizure onset. Otherwise this will be followed with serial MRI imaging. Pt is not on any medication, I will have him see our neurology colleagues if possible today.    Debbora Dus, MD

## 2019-11-29 ENCOUNTER — Other Ambulatory Visit (INDEPENDENT_AMBULATORY_CARE_PROVIDER_SITE_OTHER): Payer: Self-pay | Admitting: Neurology

## 2019-11-29 NOTE — Telephone Encounter (Signed)
Already done

## 2019-11-29 NOTE — Telephone Encounter (Signed)
[name] Idler, MD [name] Idler, MD   Outpatient Medication Detail     Disp Refills Start End    carBAMazepine (TEGRETOL) 200 mg Oral Tablet 90 Tablet 5 11/07/2019     Sig - Route: Take 1 Tablet (200 mg total) by mouth Three times a day - Oral    Sent to pharmacy as: carBAMazepine 200 mg tablet (TEGRETOL)    Class: E-Rx    Non-formulary Exception Code: YTKPT/WS Formulary Info Available    E-Prescribing Status: Receipt confirmed by pharmacy (11/07/2019 11:45 AM EDT)    Pharmacy    [id number] - [place], [place]   Additional Information

## 2019-12-29 ENCOUNTER — Ambulatory Visit
Admission: RE | Admit: 2019-12-29 | Discharge: 2019-12-29 | Disposition: A | Discharge status: Home / Self Care | Payer: MEDICAID | Source: Ambulatory Visit | Attending: Neurology | Admitting: Neurology

## 2019-12-29 ENCOUNTER — Encounter (INDEPENDENT_AMBULATORY_CARE_PROVIDER_SITE_OTHER): Payer: Self-pay | Admitting: Neurology

## 2019-12-29 ENCOUNTER — Ambulatory Visit (INDEPENDENT_AMBULATORY_CARE_PROVIDER_SITE_OTHER): Payer: Self-pay | Admitting: Neurology

## 2019-12-29 DIAGNOSIS — R569 Unspecified convulsions: Secondary | ICD-10-CM | POA: Insufficient documentation

## 2019-12-29 DIAGNOSIS — R9089 Other abnormal findings on diagnostic imaging of central nervous system: Secondary | ICD-10-CM | POA: Insufficient documentation

## 2019-12-29 NOTE — Telephone Encounter (Signed)
Patient requesting a telephone call with test results. He did not want to stay f

## 2019-12-31 NOTE — Procedures (Signed)
NAMESONNIE, PAWLOSKI   HOSPITAL NUMBER:  [id number]  DATE OF SERVICE:  12/29/2019  DOB:  [birth year]  SEX:  M      Date of study:  December 29, 2019, at 1241 to 1347.     EEG #: [id number].   Technician: BL.    REQUESTING PHYSICIAN:  Adelene Idler, MD.    HISTORY:  History of episodes of seizure-like activity and abnormal brain MRI with right temporal lesion.    MEDICATION:  Tegretol.    REPORT:  This is a digitally acquired video EEG performed with the standard 10-20 montage of electrode placement.     Awake background was characterized by diffuse alpha admixed with beta and theta frequencies with a posterior dominant rhythm of 10 Hz.     Background was continuous and symmetric with the exception of occasional right temporal slowing.     Sleep was characterized by normal and symmetric vertex waves, sleep spindles, and K complexes.     Photic stimulation did not elicit any abnormalities.    CLINICAL SPELLS:  No clinical spells were marked.    ABNORMAL ACTIVITY:  Occasional right temporal slowing.    INTERPRETATION:  Abnormal awake and asleep routine EEG due to the presence of occasional right temporal slowing suggestive of underlying focal dysfunction. Clinical correlation required      Avel Peace, MD  Assistant Professor   [facility]        CC:   Adelene Idler, MD   [address]   [place], [place] [address]       DD:  12/29/2019 17:50:18  DT:  12/31/2019 03:08:05 [employer]  D#:  [id number]    I read this EEG and agree with the resident's report. Any exceptions have been edited by me.  Ilda Mori, MD

## 2020-01-15 ENCOUNTER — Ambulatory Visit (INDEPENDENT_AMBULATORY_CARE_PROVIDER_SITE_OTHER): Payer: MEDICAID | Admitting: Neurology

## 2020-01-24 ENCOUNTER — Encounter (INDEPENDENT_AMBULATORY_CARE_PROVIDER_SITE_OTHER): Payer: Self-pay | Admitting: Neurology

## 2020-03-10 ENCOUNTER — Other Ambulatory Visit (INDEPENDENT_AMBULATORY_CARE_PROVIDER_SITE_OTHER): Payer: Self-pay | Admitting: Neurology

## 2020-03-11 NOTE — Telephone Encounter (Signed)
Last seen 11/07/19  Please erx

## 2020-04-17 ENCOUNTER — Ambulatory Visit (INDEPENDENT_AMBULATORY_CARE_PROVIDER_SITE_OTHER): Payer: Self-pay | Admitting: Anesthesiology

## 2020-04-17 NOTE — Telephone Encounter (Addendum)
MRI order faxed to [facility] per request. Fax confirmation received.  Lonia Mad, RN  04/17/2020, 14:52    Regarding: bhatia   ----- Message from Suzanne Boron sent at 04/17/2020 10:49 AM EST -----  [name] [name] was calling to see if [name] [name] can get his MRI done at [facility], please call back at [phone]

## 2020-04-24 ENCOUNTER — Ambulatory Visit (INDEPENDENT_AMBULATORY_CARE_PROVIDER_SITE_OTHER): Payer: Self-pay | Admitting: Neurology

## 2020-04-24 NOTE — Telephone Encounter (Signed)
Faxed lab orders from 11/07/19 to [facility] at [phone]

## 2020-04-24 NOTE — Telephone Encounter (Signed)
Regarding: [name] [name]  ----- Message from Brain Hilts sent at 04/24/2020  8:40 AM EST -----  Please fax the blood work orders to [facility] as well.

## 2020-05-14 ENCOUNTER — Encounter (INDEPENDENT_AMBULATORY_CARE_PROVIDER_SITE_OTHER): Payer: Self-pay | Admitting: Adult Health

## 2020-05-14 ENCOUNTER — Other Ambulatory Visit (INDEPENDENT_AMBULATORY_CARE_PROVIDER_SITE_OTHER): Payer: Self-pay

## 2020-05-14 ENCOUNTER — Encounter (INDEPENDENT_AMBULATORY_CARE_PROVIDER_SITE_OTHER): Payer: Self-pay | Admitting: Neurology

## 2020-05-14 NOTE — Progress Notes (Deleted)
Error

## 2020-12-04 ENCOUNTER — Other Ambulatory Visit (INDEPENDENT_AMBULATORY_CARE_PROVIDER_SITE_OTHER): Payer: Self-pay | Admitting: Neurology

## 2020-12-04 NOTE — Telephone Encounter (Signed)
Last seen 11/07/19  Please erx

## 2020-12-24 ENCOUNTER — Ambulatory Visit (INDEPENDENT_AMBULATORY_CARE_PROVIDER_SITE_OTHER): Payer: Self-pay

## 2020-12-24 NOTE — Telephone Encounter (Addendum)
Regarding: [name] [name]  ----- Message from Elease Hashimoto sent at 12/24/2020  2:23 PM EDT -----  [name] [name]- Patient is calling would like to know if you got the imaging of MRI Brain done at [facility] please advise     Thanks     Called and left message on patients answering machine to call back if the 11/07/19 MRI was not the most recent MRI.    Grayling Congress, MA  12/24/2020, 14:54

## 2020-12-26 ENCOUNTER — Other Ambulatory Visit (INDEPENDENT_AMBULATORY_CARE_PROVIDER_SITE_OTHER): Payer: Self-pay | Admitting: Neurology

## 2020-12-26 NOTE — Telephone Encounter (Signed)
Last seen 11/07/19  Next 02/28/21  Please erx

## 2020-12-26 NOTE — Telephone Encounter (Signed)
Regarding: [name] [name]  ----- Message from Dorann Ou sent at 12/26/2020 10:10 AM EDT -----  [name] needs a refill on his carBAMazepine (TEGRETOL) 200 mg Oral Tablet      He is picking up his last refill today    Preferred Pharmacy      [id number] [place] [place]   [id number]   Phone: [phone]: [phone]   Hours: Not open 24 hours

## 2020-12-27 MED ORDER — CARBAMAZEPINE 200 MG TABLET
200.0000 mg | ORAL_TABLET | Freq: Three times a day (TID) | ORAL | 2 refills | Status: DC
Start: 2020-12-27 — End: 2021-02-28

## 2021-02-28 ENCOUNTER — Other Ambulatory Visit: Payer: Self-pay

## 2021-02-28 ENCOUNTER — Ambulatory Visit: Payer: 59 | Attending: Neurology | Admitting: Neurology

## 2021-02-28 ENCOUNTER — Encounter (INDEPENDENT_AMBULATORY_CARE_PROVIDER_SITE_OTHER): Payer: Self-pay | Admitting: Neurology

## 2021-02-28 ENCOUNTER — Other Ambulatory Visit (INDEPENDENT_AMBULATORY_CARE_PROVIDER_SITE_OTHER): Payer: 59 | Admitting: Rheumatology

## 2021-02-28 VITALS — BP 150/72 | HR 55 | Ht 72.0 in | Wt 202.2 lb

## 2021-02-28 DIAGNOSIS — R9089 Other abnormal findings on diagnostic imaging of central nervous system: Secondary | ICD-10-CM | POA: Insufficient documentation

## 2021-02-28 DIAGNOSIS — R569 Unspecified convulsions: Secondary | ICD-10-CM

## 2021-02-28 DIAGNOSIS — Z6827 Body mass index (BMI) 27.0-27.9, adult: Secondary | ICD-10-CM

## 2021-02-28 DIAGNOSIS — G40909 Epilepsy, unspecified, not intractable, without status epilepticus: Secondary | ICD-10-CM

## 2021-02-28 LAB — CBC WITH DIFF
BASOPHIL #: <0.1 10*3/uL (ref <=0.20)
BASOPHIL %: 1 %
EOSINOPHIL #: <0.1 10*3/uL (ref <=0.50)
EOSINOPHIL %: 1 %
HCT: 46.4 % (ref 38.9–52.0)
HGB: 16.6 g/dL (ref 13.4–17.5)
IMMATURE GRANULOCYTE #: <0.1 10*3/uL (ref <0.10)
IMMATURE GRANULOCYTE %: 1 % (ref 0–1)
LYMPHOCYTE #: 2.27 10*3/uL (ref 1.00–4.80)
LYMPHOCYTE %: 28 %
MCH: 30.9 pg (ref 26.0–32.0)
MCHC: 35.8 g/dL — ABNORMAL HIGH (ref 31.0–35.5)
MCV: 86.2 fL (ref 78.0–100.0)
MONOCYTE #: 0.56 10*3/uL (ref 0.20–1.10)
MONOCYTE %: 7 %
MPV: 9.5 fL (ref 8.7–12.5)
NEUTROPHIL #: 5.18 10*3/uL (ref 1.50–7.70)
NEUTROPHIL %: 62 %
PLATELETS: 264 10*3/uL (ref 150–400)
RBC: 5.38 10*6/uL (ref 4.50–6.10)
RDW-CV: 12.1 % (ref 11.5–15.5)
WBC: 8.2 10*3/uL (ref 3.7–11.0)

## 2021-02-28 LAB — ALT (SGPT): ALT (SGPT): 21 U/L (ref 10–55)

## 2021-02-28 LAB — TEGRETOL LEVEL: CARBAMAZEPINE LEVEL: 9 ug/mL (ref 4.0–12.0)

## 2021-02-28 LAB — AST (SGOT): AST (SGOT): 17 U/L (ref 8–45)

## 2021-02-28 MED ORDER — CARBAMAZEPINE 200 MG TABLET
200.0000 mg | ORAL_TABLET | Freq: Three times a day (TID) | ORAL | 11 refills | Status: DC
Start: 2021-02-28 — End: 2021-07-01

## 2021-02-28 NOTE — Progress Notes (Signed)
[facility] Department of Neurology      Operated by [facility]  Return Patient  Date:  02/28/2021  Age:30 y.o.  Referring Physician:     Daiva Huge, DO  [address]  [place],  [place] [id number]    Subjective:       Had 3 sz's      When missed dose                  Date of study:  December 29, 2019, at 1241 to 1347.     EEG #: [id number].   Technician: BL.    REQUESTING PHYSICIAN:  Adelene Idler, MD.    HISTORY:  History of episodes of seizure-like activity and abnormal brain MRI with right temporal lesion.    MEDICATION:  Tegretol.    REPORT:  This is a digitally acquired video EEG performed with the standard 10-20 montage of electrode placement.     Awake background was characterized by diffuse alpha admixed with beta and theta frequencies with a posterior dominant rhythm of 10 Hz.     Background was continuous and symmetric with the exception of occasional right temporal slowing.     Sleep was characterized by normal and symmetric vertex waves, sleep spindles, and K complexes.     Photic stimulation did not elicit any abnormalities.    CLINICAL SPELLS:  No clinical spells were marked.    ABNORMAL ACTIVITY:  Occasional right temporal slowing.    INTERPRETATION:  Abnormal awake and asleep routine EEG due to the presence of occasional right temporal slowing suggestive of underlying focal dysfunction. Clinical correlation required          Current Outpatient Medications   Medication Sig   . acetaminophen (TYLENOL) 325 mg Oral Tablet Take 325 mg by mouth Every 4 hours as needed for Pain   . carBAMazepine (TEGRETOL) 200 mg Oral Tablet Take 1 Tablet (200 mg total) by mouth Three times a day   . ergocalciferol, vitamin D2, (DRISDOL) 1,250 mcg (50,000 unit) Oral Capsule 1 Capsule (50,000 Units total)   . ibuprofen (MOTRIN) 400 mg Oral Tablet Take 400 mg by mouth Four times a day as needed for Pain         Objective:   Vital Signs:  BP (!) 150/72    Pulse 55   Ht 1.829 m (6')   Wt 91.7 kg (202 lb 2.6 oz)   BMI 27.42 kg/m     General: appears in good health  Ophthalmoscopic: normal w/o hemorrhages, exudates, or papilledema  Carotids:Carotids normal without bruit  Orientation: Alert and oriented x 3  Memory: Registration, Recall, and Following of commands is normal  Attention: Attention and Concentration are normal  Knowledge: Good  Language: Normal  Speech: Normal  Cranial nerves: Cranial nerves 2-12 are normal  Gait:: Normal  Coordination: Coordination is normal without tremor  Sensory: Sensory exam in the upper and lower extremities is normal  Muscle tone: Upper and lower muscle tone is normal  Motor strength:  Motor strength is normal throughout.  Reflexes: hypoactive        Data Reviewed:    I have reviewed the following: outside records and internal records    Independent Interpretation:  none    Assessment and Plan:    1. Abnormal brain MRI    2. Seizures (CMS HCC)      Orders Placed This Encounter   . Cbc/Diff   . Ast (Sgot)   .  Alt (Sgpt)   . Tegretol Level   . carBAMazepine (TEGRETOL) 200 mg Oral Tablet       RTC 1 year        Adelene Idler, MD 02/28/2021, 09:26

## 2021-05-21 ENCOUNTER — Other Ambulatory Visit (INDEPENDENT_AMBULATORY_CARE_PROVIDER_SITE_OTHER): Payer: Self-pay | Admitting: Family Medicine

## 2021-05-28 ENCOUNTER — Other Ambulatory Visit (INDEPENDENT_AMBULATORY_CARE_PROVIDER_SITE_OTHER): Payer: Self-pay | Admitting: Family Medicine

## 2021-06-04 ENCOUNTER — Other Ambulatory Visit (INDEPENDENT_AMBULATORY_CARE_PROVIDER_SITE_OTHER): Payer: Self-pay | Admitting: Family Medicine

## 2021-06-04 DIAGNOSIS — R930 Abnormal findings on diagnostic imaging of skull and head, not elsewhere classified: Secondary | ICD-10-CM

## 2021-06-04 DIAGNOSIS — R569 Unspecified convulsions: Secondary | ICD-10-CM

## 2021-07-01 ENCOUNTER — Encounter (INDEPENDENT_AMBULATORY_CARE_PROVIDER_SITE_OTHER): Payer: Self-pay | Admitting: Student in an Organized Health Care Education/Training Program

## 2021-07-01 ENCOUNTER — Telehealth: Payer: 59 | Admitting: Student in an Organized Health Care Education/Training Program

## 2021-07-01 ENCOUNTER — Other Ambulatory Visit: Payer: Self-pay

## 2021-07-01 VITALS — BP 132/70 | HR 54 | Ht 73.0 in | Wt 184.0 lb

## 2021-07-01 DIAGNOSIS — G40909 Epilepsy, unspecified, not intractable, without status epilepticus: Secondary | ICD-10-CM

## 2021-07-01 DIAGNOSIS — R9089 Other abnormal findings on diagnostic imaging of central nervous system: Secondary | ICD-10-CM

## 2021-07-01 MED ORDER — CARBAMAZEPINE ER 200 MG TABLET,EXTENDED RELEASE,12 HR
400.0000 mg | ORAL_TABLET | Freq: Two times a day (BID) | ORAL | 1 refills | Status: DC
Start: 2021-07-01 — End: 2021-10-13
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
## 2021-08-25 ENCOUNTER — Observation Stay (HOSPITAL_COMMUNITY): Payer: 59 | Admitting: Surgery

## 2021-08-25 ENCOUNTER — Observation Stay
Admission: EM | Admit: 2021-08-25 | Discharge: 2021-08-26 | Disposition: A | Discharge status: Home / Self Care | Payer: 59 | Attending: Surgery | Admitting: Surgery

## 2021-08-25 ENCOUNTER — Encounter (HOSPITAL_COMMUNITY): Payer: Self-pay

## 2021-08-25 ENCOUNTER — Emergency Department (HOSPITAL_COMMUNITY): Payer: 59

## 2021-08-25 ENCOUNTER — Other Ambulatory Visit: Payer: Self-pay

## 2021-08-25 DIAGNOSIS — K37 Unspecified appendicitis: Secondary | ICD-10-CM

## 2021-08-25 DIAGNOSIS — R001 Bradycardia, unspecified: Secondary | ICD-10-CM | POA: Insufficient documentation

## 2021-08-25 DIAGNOSIS — K3580 Unspecified acute appendicitis: Principal | ICD-10-CM | POA: Insufficient documentation

## 2021-08-25 DIAGNOSIS — G40909 Epilepsy, unspecified, not intractable, without status epilepticus: Secondary | ICD-10-CM

## 2021-08-25 DIAGNOSIS — Z87891 Personal history of nicotine dependence: Secondary | ICD-10-CM | POA: Insufficient documentation

## 2021-08-25 DIAGNOSIS — Z79899 Other long term (current) drug therapy: Secondary | ICD-10-CM | POA: Insufficient documentation

## 2021-08-25 LAB — CBC WITH DIFF
BASOPHIL #: 0 10*3/uL (ref 0.00–0.30)
BASOPHIL %: 0 % (ref 0–3)
EOSINOPHIL #: 0.1 10*3/uL (ref 0.00–0.80)
EOSINOPHIL %: 1 % (ref 0–7)
HCT: 42.8 % (ref 42.0–51.0)
HGB: 14.9 g/dL (ref 13.5–18.0)
LYMPHOCYTE #: 1.8 10*3/uL (ref 1.10–5.00)
LYMPHOCYTE %: 13 % — ABNORMAL LOW (ref 25–45)
MCH: 30.1 pg (ref 27.0–32.0)
MCHC: 34.9 g/dL (ref 32.0–36.0)
MCV: 86.1 fL (ref 78.0–99.0)
MONOCYTE #: 0.7 10*3/uL (ref 0.00–1.30)
MONOCYTE %: 5 % (ref 0–12)
MPV: 8.1 fL (ref 7.4–10.4)
NEUTROPHIL #: 11.9 10*3/uL — ABNORMAL HIGH (ref 1.80–8.40)
NEUTROPHIL %: 81 % — ABNORMAL HIGH (ref 40–76)
PLATELETS: 252 10*3/uL (ref 140–440)
RBC: 4.97 10*6/uL (ref 4.20–6.00)
RDW: 13.5 % (ref 11.6–14.8)
WBC: 14.6 10*3/uL — ABNORMAL HIGH (ref 4.0–10.5)
WBCS UNCORRECTED: 14.6 10*3/uL

## 2021-08-25 LAB — GRAY TOP TUBE

## 2021-08-25 LAB — URINALYSIS, MACROSCOPIC
BILIRUBIN: NEGATIVE mg/dL
BLOOD: 0.06 mg/dL — AB
GLUCOSE: NEGATIVE mg/dL
LEUKOCYTES: NEGATIVE WBCs/uL
NITRITE: NEGATIVE
PH: 5.5 (ref 5.0–9.0)
PROTEIN: 20 mg/dL
SPECIFIC GRAVITY: 1.034 — ABNORMAL HIGH (ref 1.002–1.030)
UROBILINOGEN: NORMAL mg/dL

## 2021-08-25 LAB — COMPREHENSIVE METABOLIC PANEL, NON-FASTING
ALBUMIN/GLOBULIN RATIO: 1.6 — ABNORMAL HIGH (ref 0.8–1.4)
ALBUMIN: 4.8 g/dL (ref 3.5–5.7)
ALKALINE PHOSPHATASE: 68 U/L (ref 34–104)
ALT (SGPT): 16 U/L (ref 7–52)
ANION GAP: 10 mmol/L (ref 10–20)
AST (SGOT): 13 U/L (ref 13–39)
BILIRUBIN TOTAL: 0.4 mg/dL (ref 0.3–1.2)
BUN/CREA RATIO: 15 (ref 6–22)
BUN: 14 mg/dL (ref 7–25)
CALCIUM, CORRECTED: 8.7 mg/dL — ABNORMAL LOW (ref 8.9–10.8)
CALCIUM: 9.5 mg/dL (ref 8.6–10.3)
CHLORIDE: 103 mmol/L (ref 98–107)
CO2 TOTAL: 24 mmol/L (ref 21–31)
CREATININE: 0.92 mg/dL (ref 0.60–1.30)
ESTIMATED GFR: 113 mL/min/{1.73_m2} (ref >59)
GLOBULIN: 3 (ref 2.9–5.4)
GLUCOSE: 86 mg/dL (ref 74–109)
OSMOLALITY, CALCULATED: 274 mOsm/kg (ref 270–290)
POTASSIUM: 3.6 mmol/L (ref 3.5–5.1)
PROTEIN TOTAL: 7.8 g/dL (ref 6.4–8.9)
SODIUM: 137 mmol/L (ref 136–145)

## 2021-08-25 LAB — URINALYSIS, MICROSCOPIC: WBCS: <1 /hpf (ref <6)

## 2021-08-25 LAB — BLUE TOP TUBE

## 2021-08-25 LAB — LIPASE: LIPASE: 15 U/L (ref 11–82)

## 2021-08-25 MED ORDER — PIPERACILLIN-TAZOBACTAM 3.375 GRAM INTRAVENOUS SOLUTION
INTRAVENOUS | Status: AC
Start: 2021-08-25 — End: 2021-08-25
  Filled 2021-08-25: qty 15

## 2021-08-25 MED ORDER — ONDANSETRON HCL (PF) 4 MG/2 ML INJECTION SOLUTION
INTRAMUSCULAR | Status: AC
Start: 2021-08-25 — End: 2021-08-25
  Filled 2021-08-25: qty 2

## 2021-08-25 MED ORDER — MORPHINE 4 MG/ML INJECTION WRAPPER
INJECTION | INTRAMUSCULAR | Status: AC
Start: 2021-08-25 — End: 2021-08-25
  Filled 2021-08-25: qty 1

## 2021-08-25 MED ORDER — ONDANSETRON HCL (PF) 4 MG/2 ML INJECTION SOLUTION
4.0000 mg | Freq: Four times a day (QID) | INTRAMUSCULAR | Status: DC | PRN
Start: 2021-08-25 — End: 2021-08-27
  Administered 2021-08-25: 4 mg via INTRAVENOUS

## 2021-08-25 MED ORDER — SODIUM CHLORIDE 0.9 % (FLUSH) INJECTION SYRINGE
3.0000 mL | INJECTION | INTRAMUSCULAR | Status: DC | PRN
Start: 2021-08-25 — End: 2021-08-27

## 2021-08-25 MED ORDER — SODIUM CHLORIDE 0.9 % INTRAVENOUS PIGGYBACK
3.3750 g | Freq: Four times a day (QID) | INTRAVENOUS | Status: DC
Start: 2021-08-25 — End: 2021-08-27
  Administered 2021-08-25: 3.375 g via INTRAVENOUS
  Administered 2021-08-25: 0 g via INTRAVENOUS
  Administered 2021-08-26 (×2): 3.375 g via INTRAVENOUS
  Administered 2021-08-26: 0 g via INTRAVENOUS

## 2021-08-25 MED ORDER — IOHEXOL 350 MG IODINE/ML INTRAVENOUS SOLUTION
50.0000 mL | INTRAVENOUS | Status: AC
Start: 2021-08-25 — End: 2021-08-25
  Administered 2021-08-25: 75 mL via INTRAVENOUS

## 2021-08-25 MED ORDER — MORPHINE 4 MG/ML INJECTION WRAPPER
4.0000 mg | INJECTION | INTRAMUSCULAR | Status: DC | PRN
Start: 2021-08-25 — End: 2021-08-27
  Administered 2021-08-25: 4 mg via INTRAVENOUS

## 2021-08-25 MED ORDER — SODIUM CHLORIDE 0.9 % (FLUSH) INJECTION SYRINGE
3.0000 mL | INJECTION | Freq: Three times a day (TID) | INTRAMUSCULAR | Status: DC
Start: 2021-08-25 — End: 2021-08-27
  Administered 2021-08-25: 3 mL
  Administered 2021-08-26: 0 mL

## 2021-08-25 NOTE — ED Triage Notes (Signed)
Abdominal pain from upper left to middle right today.  Nausea, vomiting.  Diarrhea for six months.

## 2021-08-25 NOTE — ED Provider Notes (Signed)
[facility]  ED Primary Provider Note  History of Present Illness   Chief Complaint   Patient presents with   . Abdominal Pain     [name] [name] is a 31 y.o. male who had concerns including Abdominal Pain.  Arrival: The patient arrived by Private Vehicle    Patient 31 year old male past will history of seizure disorder and benign bradycardia presents the emergency department with complaints of right lower quadrant pain.  States started earlier today.  He states he has been eating and drinking without issue.  Denies fevers chills.  He states he does have some nausea when he wakes up sometimes.        Review of Systems   Pertinent positive and negative ROS as per HPI.  Historical Data   History Reviewed This Encounter:      Physical Exam   ED Triage Vitals [08/25/21 1938]   BP (Non-Invasive) (!) 140/79   Heart Rate 56   Respiratory Rate 20   Temperature 36.7 C (98 F)   SpO2 96 %   Weight 84.8 kg (187 lb)   Height 1.854 m (_0 )     Physical Exam  Constitutional:       Appearance: Normal appearance.   HENT:      Head: Normocephalic.      Mouth/Throat:      Mouth: Mucous membranes are moist.   Eyes:      Extraocular Movements: Extraocular movements intact.      Pupils: Pupils are equal, round, and reactive to light.   Cardiovascular:      Rate and Rhythm: Normal rate and regular rhythm.      Pulses: Normal pulses.   Pulmonary:      Effort: Pulmonary effort is normal.   Abdominal:      General: Abdomen is flat.      Palpations: Abdomen is soft.      Comments: Right lower quadrant tender to palpation no rebound or guarding negative Rovsing's   Neurological:      Mental Status: He is alert.       Patient Data     Labs Ordered/Reviewed   COMPREHENSIVE METABOLIC PANEL, NON-FASTING - Abnormal; Notable for the following components:       Result Value    ALBUMIN/GLOBULIN RATIO 1.6 (*)     CALCIUM, CORRECTED 8.7 (*)     All other components within normal limits    Narrative:     Estimated  Glomerular Filtration Rate (eGFR) is calculated using the CKD-EPI (2021) equation, intended for patients 45 years of age and older. If gender is not documented or "unknown", there will be no eGFR calculation.   CBC WITH DIFF - Abnormal; Notable for the following components:    WBC 14.6 (*)     NEUTROPHIL % 81 (*)     LYMPHOCYTE % 13 (*)     NEUTROPHIL # 11.90 (*)     All other components within normal limits   URINALYSIS, MACROSCOPIC - Abnormal; Notable for the following components:    SPECIFIC GRAVITY 1.034 (*)     BLOOD 0.06 (*)     All other components within normal limits   URINALYSIS, MICROSCOPIC - Abnormal; Notable for the following components:    MUCOUS Occasional (*)     All other components within normal limits   LIPASE - Normal   CBC/DIFF    Narrative:     The following orders were created for panel order CBC/DIFF.  Procedure                               Abnormality         Status                     ---------                               -----------         ------                     CBC WITH HKFE[[id number]]                Abnormal            Final result                 Please view results for these tests on the individual orders.   URINALYSIS, MACROSCOPIC AND MICROSCOPIC W/CULTURE REFLEX    Narrative:     The following orders were created for panel order URINALYSIS, MACROSCOPIC AND MICROSCOPIC W/CULTURE REFLEX [PRN ONLY].  Procedure                               Abnormality         Status                     ---------                               -----------         ------                     URINALYSIS, MACROSCOPIC[[id number]]      Abnormal            Final result               URINALYSIS, MICROSCOPIC[[id number]]      Abnormal            Final result                 Please view results for these tests on the individual orders.   EXTRA TUBES    Narrative:     The following orders were created for panel order EXTRA TUBES.  Procedure                               Abnormality         Status                      ---------                               -----------         ------                     Oren Binet TOP VFMB[[id number]]  In process                 GOLD TOP KKDP[[id number]]                                    In process                 GRAY TOP IDUP[[id number]]                                    In process                   Please view results for these tests on the individual orders.   BLUE TOP TUBE   GOLD TOP TUBE   GRAY TOP TUBE     CT ABDOMEN PELVIS W IV CONTRAST   Final Result by Edi, Radresults In (05/01 2125)   Distended appendix with wall thickening and surrounding inflammation most consistent with acute appendicitis. No free air or abscess.          One or more dose reduction techniques were used (e.g., Automated exposure control, adjustment of the mA and/or kV according to patient size, use of iterative reconstruction technique).         Radiologist location ID: [place] Decision Making        Medical Decision Making  Patient is found have leukocytosis CT scan demonstrated acute appendicitis without perforation or abscess.  Discussed case with Dr. Venia Minks will admit the patient plan OR in the morning.  Patient started on Zosyn in the ED.    Risk  Prescription drug management.  Parenteral controlled substances.               Medications Administered in the ED   NS flush syringe (has no administration in time range)   NS flush syringe (has no administration in time range)   piperacillin-tazobactam (ZOSYN) 3.375 g in NS 100 mL IVPB minibag (has no administration in time range)   morphine 4 mg/mL injection (has no administration in time range)   ondansetron (ZOFRAN) 2 mg/mL injection (has no administration in time range)   iohexol (OMNIPAQUE 350) infusion (75 mL Intravenous Given 08/25/21 2117)     Clinical Impression   Appendicitis, unspecified appendicitis type (Primary)       Disposition: Admitted

## 2021-08-25 NOTE — ED APP Handoff Note (Signed)
[facility]  Emergency Department  Provider in Triage Note    Name: [name] [name]  Age: 31 y.o.  Gender: male     Subjective:   [name] [name] is a 31 y.o. male who presents with complaint of Abdominal Pain  .  Pt to the ED c/o abdominal pain in RLQ that radiates into LUQ with movement  that started today. Pt rates pain an 8/10 that he describes as constant stabbing pain.  Pt also c/o diarrhea X 6 months. Pt denies fever or vomiting. Pt  Denies chest pain or shortness of breath.     Objective:   Filed Vitals:    08/25/21 1938   BP: (!) 140/79   Pulse: 56   Resp: 20   Temp: 36.7 C (98 F)   SpO2: 96%      Focused Physical Exam shows RLQ tenderness to palpation with rebound tenderness. Negative Murphy's. Negative CVA tenderness. Hyperactive bowel sounds.       Plan:  Please see initial orders and work-up below.  This is to be continued with full evaluation in the main Emergency Department.     No current facility-administered medications for this encounter.     Results for orders placed or performed during the hospital encounter of 08/25/21 (from the past 24 hour(s))   CBC/DIFF    Narrative    The following orders were created for panel order CBC/DIFF.  Procedure                               Abnormality         Status                     ---------                               -----------         ------                     CBC WITH VQXI[[id number]]                                                                 Please view results for these tests on the individual orders.   URINALYSIS, MACROSCOPIC AND MICROSCOPIC W/CULTURE REFLEX [PRN ONLY]    Specimen: Urine, Clean Catch    Narrative    The following orders were created for panel order URINALYSIS, MACROSCOPIC AND MICROSCOPIC W/CULTURE REFLEX [PRN ONLY].  Procedure                               Abnormality         Status                     ---------                               -----------         ------  URINALYSIS,  MACROSCOPIC[[id number]]                                                     URINALYSIS, MICROSCOPIC[[id number]]                                                       Please view results for these tests on the individual orders.                 Lajean Saver, FNP-C

## 2021-08-26 ENCOUNTER — Encounter (HOSPITAL_COMMUNITY): Admission: EM | Disposition: A | Discharge status: Home / Self Care | Payer: Self-pay | Source: Home / Self Care

## 2021-08-26 ENCOUNTER — Observation Stay (HOSPITAL_COMMUNITY): Payer: 59 | Admitting: Anesthesiology

## 2021-08-26 LAB — GOLD TOP TUBE

## 2021-08-26 SURGERY — APPENDECTOMY LAPAROSCOPIC
Anesthesia: General | Site: Abdomen | Wound class: Clean Wound: Uninfected operative wounds in which no inflammation occurred

## 2021-08-26 MED ORDER — ROPIVACAINE (PF) 2 MG/ML (0.2 %) INJECTION SOLUTION
Freq: Once | INTRAMUSCULAR | Status: DC | PRN
Start: 2021-08-26 — End: 2021-08-27
  Administered 2021-08-26: 18 mL via INTRAMUSCULAR

## 2021-08-26 MED ORDER — HYDROCODONE 5 MG-ACETAMINOPHEN 325 MG TABLET
1.0000 | ORAL_TABLET | ORAL | Status: DC | PRN
Start: 2021-08-26 — End: 2021-08-27
  Administered 2021-08-26: 1 via ORAL
  Filled 2021-08-26: qty 1

## 2021-08-26 MED ORDER — FENTANYL (PF) 50 MCG/ML INJECTION SOLUTION
INTRAMUSCULAR | Status: AC
Start: 2021-08-26 — End: 2021-08-26
  Filled 2021-08-26: qty 2

## 2021-08-26 MED ORDER — LACTATED RINGERS INTRAVENOUS SOLUTION
INTRAVENOUS | Status: DC | PRN
Start: 2021-08-26 — End: 2021-08-26

## 2021-08-26 MED ORDER — ROCURONIUM 10 MG/ML INTRAVENOUS SYRINGE WRAPPER
INJECTION | Freq: Once | INTRAVENOUS | Status: DC | PRN
Start: 2021-08-26 — End: 2021-08-26
  Administered 2021-08-26: 20 mg via INTRAVENOUS
  Administered 2021-08-26: 50 mg via INTRAVENOUS

## 2021-08-26 MED ORDER — PROCHLORPERAZINE EDISYLATE 10 MG/2 ML (5 MG/ML) INJECTION SOLUTION
5.0000 mg | Freq: Once | INTRAMUSCULAR | Status: DC | PRN
Start: 2021-08-26 — End: 2021-08-27

## 2021-08-26 MED ORDER — LACTATED RINGERS INTRAVENOUS SOLUTION
INTRAVENOUS | Status: DC
Start: 2021-08-26 — End: 2021-08-27

## 2021-08-26 MED ORDER — FENTANYL (PF) 50 MCG/ML INJECTION WRAPPER
25.0000 ug | INJECTION | INTRAMUSCULAR | Status: DC | PRN
Start: 2021-08-26 — End: 2021-08-27

## 2021-08-26 MED ORDER — SUGAMMADEX 100 MG/ML INTRAVENOUS SOLUTION
Freq: Once | INTRAVENOUS | Status: DC | PRN
Start: 2021-08-26 — End: 2021-08-26
  Administered 2021-08-26: 300 mg via INTRAVENOUS

## 2021-08-26 MED ORDER — MIDAZOLAM 5 MG/ML INJECTION WRAPPER
3.0000 mg | Freq: Once | INTRAMUSCULAR | Status: DC | PRN
Start: 2021-08-26 — End: 2021-08-27
  Administered 2021-08-26: 3 mg via INTRAVENOUS

## 2021-08-26 MED ORDER — FAMOTIDINE (PF) 20 MG/2 ML INTRAVENOUS SOLUTION
20.0000 mg | Freq: Once | INTRAVENOUS | Status: AC
Start: 2021-08-26 — End: 2021-08-26
  Administered 2021-08-26: 20 mg via INTRAVENOUS

## 2021-08-26 MED ORDER — CARBAMAZEPINE ER 200 MG TABLET,EXTENDED RELEASE,12 HR
400.0000 mg | ORAL_TABLET | Freq: Two times a day (BID) | ORAL | Status: DC
Start: 2021-08-26 — End: 2021-08-27
  Filled 2021-08-26 (×4): qty 2

## 2021-08-26 MED ORDER — ONDANSETRON HCL (PF) 4 MG/2 ML INJECTION SOLUTION
4.0000 mg | Freq: Four times a day (QID) | INTRAMUSCULAR | Status: DC | PRN
Start: 2021-08-26 — End: 2021-08-27

## 2021-08-26 MED ORDER — SODIUM CHLORIDE 0.9 % INTRAVENOUS PIGGYBACK
INJECTION | INTRAVENOUS | Status: AC
Start: 2021-08-26 — End: 2021-08-26
  Filled 2021-08-26: qty 100

## 2021-08-26 MED ORDER — DEXAMETHASONE SODIUM PHOSPHATE 4 MG/ML INJECTION SOLUTION
INTRAMUSCULAR | Status: AC
Start: 2021-08-26 — End: 2021-08-26
  Filled 2021-08-26: qty 1

## 2021-08-26 MED ORDER — SODIUM CHLORIDE 0.9 % (FLUSH) INJECTION SYRINGE
3.0000 mL | INJECTION | INTRAMUSCULAR | Status: DC | PRN
Start: 2021-08-26 — End: 2021-08-27

## 2021-08-26 MED ORDER — FENTANYL (PF) 50 MCG/ML INJECTION WRAPPER
50.0000 ug | INJECTION | INTRAMUSCULAR | Status: DC | PRN
Start: 2021-08-26 — End: 2021-08-27
  Administered 2021-08-26 (×2): 50 ug via INTRAVENOUS

## 2021-08-26 MED ORDER — FENTANYL (PF) 50 MCG/ML INJECTION WRAPPER
INJECTION | Freq: Once | INTRAMUSCULAR | Status: DC | PRN
Start: 2021-08-26 — End: 2021-08-26
  Administered 2021-08-26 (×2): 50 ug via INTRAVENOUS

## 2021-08-26 MED ORDER — ONDANSETRON HCL (PF) 4 MG/2 ML INJECTION SOLUTION
INTRAMUSCULAR | Status: AC
Start: 2021-08-26 — End: 2021-08-26
  Filled 2021-08-26: qty 2

## 2021-08-26 MED ORDER — PIPERACILLIN-TAZOBACTAM 3.375 GRAM INTRAVENOUS SOLUTION
INTRAVENOUS | Status: AC
Start: 2021-08-26 — End: 2021-08-26
  Filled 2021-08-26: qty 15

## 2021-08-26 MED ORDER — ONDANSETRON HCL (PF) 4 MG/2 ML INJECTION SOLUTION
4.0000 mg | Freq: Once | INTRAMUSCULAR | Status: DC | PRN
Start: 2021-08-26 — End: 2021-08-27
  Administered 2021-08-26: 4 mg via INTRAVENOUS

## 2021-08-26 MED ORDER — SODIUM CHLORIDE 0.9 % (FLUSH) INJECTION SYRINGE
3.0000 mL | INJECTION | Freq: Three times a day (TID) | INTRAMUSCULAR | Status: DC
Start: 2021-08-26 — End: 2021-08-27

## 2021-08-26 MED ORDER — HYDROMORPHONE 2 MG/ML INJECTION WRAPPER
INJECTION | Freq: Once | INTRAMUSCULAR | Status: DC | PRN
Start: 2021-08-26 — End: 2021-08-26
  Administered 2021-08-26: .4 mg via INTRAVENOUS

## 2021-08-26 MED ORDER — MIDAZOLAM 5 MG/ML INJECTION WRAPPER
INTRAMUSCULAR | Status: AC
Start: 2021-08-26 — End: 2021-08-26
  Filled 2021-08-26: qty 1

## 2021-08-26 MED ORDER — ALBUTEROL SULFATE 2.5 MG/3 ML (0.083 %) SOLUTION FOR NEBULIZATION
2.5000 mg | INHALATION_SOLUTION | Freq: Once | RESPIRATORY_TRACT | Status: DC | PRN
Start: 2021-08-26 — End: 2021-08-27

## 2021-08-26 MED ORDER — AMOXICILLIN 875 MG-POTASSIUM CLAVULANATE 125 MG TABLET
1.0000 | ORAL_TABLET | Freq: Two times a day (BID) | ORAL | 0 refills | Status: DC
Start: 2021-08-26 — End: 2021-10-13

## 2021-08-26 MED ORDER — LIDOCAINE HCL 4 % LARYNGOTRACHEAL SOLUTION
LARYNGEAL | Status: AC
Start: 2021-08-26 — End: 2021-08-26
  Filled 2021-08-26: qty 1

## 2021-08-26 MED ORDER — HYDROCODONE 5 MG-ACETAMINOPHEN 325 MG TABLET
1.0000 | ORAL_TABLET | Freq: Three times a day (TID) | ORAL | 0 refills | Status: AC | PRN
Start: 2021-08-26 — End: 2021-09-02

## 2021-08-26 MED ORDER — IPRATROPIUM 0.5 MG-ALBUTEROL 3 MG (2.5 MG BASE)/3 ML NEBULIZATION SOLN
3.0000 mL | INHALATION_SOLUTION | Freq: Once | RESPIRATORY_TRACT | Status: DC | PRN
Start: 2021-08-26 — End: 2021-08-27

## 2021-08-26 MED ORDER — MORPHINE 2 MG/ML INJECTION WRAPPER
2.0000 mg | INJECTION | INTRAMUSCULAR | Status: DC
Start: 2021-08-26 — End: 2021-08-27

## 2021-08-26 MED ORDER — ONDANSETRON HCL (PF) 4 MG/2 ML INJECTION SOLUTION
4.0000 mg | Freq: Once | INTRAMUSCULAR | Status: AC
Start: 2021-08-26 — End: 2021-08-26
  Administered 2021-08-26: 4 mg via INTRAVENOUS

## 2021-08-26 MED ORDER — SIMETHICONE 40 MG/0.6 ML ORAL DROPS,SUSPENSION
40.0000 mg | Freq: Four times a day (QID) | ORAL | Status: DC | PRN
Start: 2021-08-26 — End: 2021-08-27
  Filled 2021-08-26: qty 30

## 2021-08-26 MED ORDER — ROPIVACAINE (PF) 2 MG/ML (0.2 %) INJECTION SOLUTION
INTRAMUSCULAR | Status: AC
Start: 2021-08-26 — End: 2021-08-26
  Filled 2021-08-26: qty 20

## 2021-08-26 MED ORDER — KETOROLAC 30 MG/ML (1 ML) INJECTION SOLUTION
Freq: Once | INTRAMUSCULAR | Status: DC | PRN
Start: 2021-08-26 — End: 2021-08-26
  Administered 2021-08-26: 30 mg via INTRAVENOUS

## 2021-08-26 MED ORDER — MORPHINE 2 MG/ML INJECTION WRAPPER
2.0000 mg | INJECTION | INTRAMUSCULAR | Status: DC | PRN
Start: 2021-08-26 — End: 2021-08-27
  Administered 2021-08-26 (×2): 2 mg via INTRAVENOUS
  Filled 2021-08-26 (×2): qty 1

## 2021-08-26 MED ORDER — FAMOTIDINE (PF) 20 MG/2 ML INTRAVENOUS SOLUTION
INTRAVENOUS | Status: AC
Start: 2021-08-26 — End: 2021-08-26
  Filled 2021-08-26: qty 2

## 2021-08-26 MED ORDER — DEXAMETHASONE SODIUM PHOSPHATE 4 MG/ML INJECTION SOLUTION
4.0000 mg | Freq: Once | INTRAMUSCULAR | Status: AC
Start: 2021-08-26 — End: 2021-08-26
  Administered 2021-08-26: 4 mg via INTRAVENOUS

## 2021-08-26 MED ORDER — LIDOCAINE (PF) 100 MG/5 ML (2 %) INTRAVENOUS SYRINGE
INJECTION | Freq: Once | INTRAVENOUS | Status: DC | PRN
Start: 2021-08-26 — End: 2021-08-26
  Administered 2021-08-26: 80 mg via INTRAVENOUS

## 2021-08-26 MED ORDER — PROPOFOL 10 MG/ML IV BOLUS
INJECTION | Freq: Once | INTRAVENOUS | Status: DC | PRN
Start: 2021-08-26 — End: 2021-08-26
  Administered 2021-08-26: 200 mg via INTRAVENOUS

## 2021-08-26 SURGICAL SUPPLY — 82 items
ADH LIQUID LF  WTPRF VIAL PREP NONSTAIN MASTISOL STYRAX GUM MASTIC ALC MTHY SLCYT STRL CLR NHZR 2/3 (WOUND CARE SUPPLY) ×1 IMPLANT
ADH LQ LF VIAL AMP PREP MASTI_SOL STYRAX GUM MASTIC ALC MTHY (WOUND CARE/ENTEROSTOMAL SUPPLY) ×1
APPLIER E-CLP III SUP INTLK 33CM 5MM PSTL GRIP GLARE RST SAF INTLK HNDL 16 CLIP TI MED LRG INTERNAL (WOUND CARE SUPPLY) ×1 IMPLANT
APPLIER E-CLP III SUP INTLK 33_CM 5MM PSTL GRIP GLARE RST SAF (WOUND CARE/ENTEROSTOMAL SUPPLY) ×1
BAG DRAIN 2000ML ANTIREFLUX TWR SLIDE TAP PORT BLUNT CANN LF (UROLOGICAL SUPPLIES) ×2 IMPLANT
BAG SUT DVN STRL LF (SUTURE/WOUND CLOSURE) ×1 IMPLANT
BAG SUTURE DEVON STERILE LATEX FREE (SUTURE/WOUND CLOSURE) ×1
BLADE 15 2 END CBNSTL SURG STRL DISP (CUTTING ELEMENTS) ×1
BLADE 15 2 END CBNSTL SURG STRL DISP (SURGICAL CUTTING SUPPLIES) ×1 IMPLANT
CATH URETH DOVER 16FR FOLEY 2W LRG SMOOTH DRAIN EYE FIRM TIP SIL 10ML STRL LF  BLU STRP CLR (UROLOGICAL SUPPLIES) ×1 IMPLANT
CATH URETH DOVER 16FR FOLEY 2W_LRG SMOOTH DRAIN EYE FIRM TIP (UROLOGICAL SUPPLIES) ×1
CLEANER INSTR PREPZYME MUL-TRD CONTAINR NARSL NEUT PH BDGR (MISCELLANEOUS PT CARE ITEMS) ×1
CONTAINR 90ML LEAK RST LID PATIENT LBL PNEUM TUBE TMPR EVD (MISCELLANEOUS PT CARE ITEMS) ×1
CONTAINR 90ML LEAK RST LID PATIENT LBL PNEUM TUBE TMPR EVD SEAL STRL ORNG SPECI LF  DISP (MISCELLANEOUS PT CARE ITEMS) ×1 IMPLANT
CONV USE 102436 - NEEDLE HYPO  22GA 1.5IN STD MONOJECT SS POLYPROP REG BVL LL HUB UL SHRP ANTICORE BLU STRL LF  DISP (MED SURG SUPPLIES) ×1 IMPLANT
CONV USE 80463 - NEEDLE 1.5IN 18GA FIL FILTER STRL BLUNT MONOJECT LF  DISP (MED SURG SUPPLIES) ×1 IMPLANT
CONV USE ITEM 321854 - GLOVE SURG 6 LF  BEAD CUF SMOOTH HI GRIP WHT 12IN MDCHC PLISPRN (GLOVES AND ACCESSORIES) ×1 IMPLANT
CONV USE ITEM 329146 - CLEANER INSTR PREPZYME MUL-TRD CONTAINR NARSL NEUT PH BDGR 22OZ (MISCELLANEOUS PT CARE ITEMS) ×1 IMPLANT
CONV USE ITEM 343591 - SOLIDIFY FLUID 1500CC NONST LF  PREM SOLIDIFY + (MED SURG SUPPLIES) ×1 IMPLANT
CONV USE ITEM 45435 - STRIP SKNCLS MDSTRP FBR NYL POR MED 4X.5IN ADH HYPOALL REINF (SUTURE/WOUND CLOSURE) ×1 IMPLANT
CONV USE ITEM 49641 - TROCAR LAPSCP 100MM 5MM KII Z THREAD SLEEVE SHIELD BLADE STRL LF  ACCESS SYS ABDOMINAL (ENDOSCOPIC SUPPLIES) ×1 IMPLANT
CONV USE ITEM 81996 - SLEEVE LAPSCP 5MM OPTC ACCESS SYS 100MM KII ABDOMINAL Z THREAD CANN SEAL STRL LF (ENDOSCOPIC SUPPLIES) ×1 IMPLANT
CONV USE ITEM 91385 - SUTURE 0 GS-22 POLYSRB 30IN VIOL BRD COAT ABS (SUTURE/WOUND CLOSURE) ×1 IMPLANT
COUNTER 20 CNT BLOCK ADH NEEDLE STRL LF  RD SHARP FOAM 15.75X11.5X14IN DISP (MED SURG SUPPLIES) ×1 IMPLANT
COUNTER 20 CNT BLOCK ADH NEEDLE STRL LF RD SHARP FOAM 15.75 (MED SURG SUPPLIES) ×1
COVER 53X24IN MAYOSTAND PRXM STRL DISP EQP SMS LF (DRAPE/PACKS/SHEETS/OR TOWEL) ×1 IMPLANT
COVER TBL 90X50IN STD SMS REINF FNFLD STRL LF  DISP (DRAPE/PACKS/SHEETS/OR TOWEL) ×2 IMPLANT
COVER TBL 90X50IN STD SMS REINF FNFLD STRL LF DISP (DRAPE/PACKS/SHEETS/OR TOWEL) ×2
DEVICE SPEC RETR INZII 10MM GUIDE BEAD STD ENDOS 225ML LF (ENDOSCOPIC SUPPLIES) ×1 IMPLANT
DEVICE SPEC RETR INZII 10MM GU_IDE BEAD STD ENDOS 225ML LF (INSTRUMENTS ENDOMECHANICAL) ×1
DRAPE ABS FENESTRATE ADH 121X102X77IN ABDOMINAL 12IN 13IN PRXM LF  STRL DISP SURG SMS 20X36IN (DRAPE/PACKS/SHEETS/OR TOWEL) ×1 IMPLANT
DRAPE ABS FENESTRATE ADH 121X1_02X77IN ABDOMINAL 12IN 13IN (DRAPE/PACKS/SHEETS/OR TOWEL) ×1
DRAPE MAYOSTAND CVR 53X24IN PR_XM LF STRL DISP EQP SMS (DRAPE/PACKS/SHEETS/OR TOWEL) ×1
GLOVE SURG 6 LF PF SMOOTH STRL WHT PLISPRN (GLOVES AND ACCESSORIES) ×1
GLOVE SURG 7 LF  PF STRL PLISPRN DISP (GLOVES AND ACCESSORIES) ×3 IMPLANT
GLOVE SURG 7 LF PF SMOOTH STRL WHT PLISPRN (GLOVES AND ACCESSORIES) ×3
GOWN SURG LRG STD LGTH REG L3 NONREINFORCE BRTHBL TWL STRL (DRAPE/PACKS/SHEETS/OR TOWEL) ×2
GOWN SURG LRG STD LGTH REG L3 NONREINFORCE BRTHBL TWL STRL LF  DISP BLU HALYARD SPECTRUM SMS (DRAPE/PACKS/SHEETS/OR TOWEL) ×2 IMPLANT
GOWN SURG XL STD LGTH L3 HKLP CLSR RGLN SLEEVE TWL STRL LF (DRAPE/PACKS/SHEETS/OR TOWEL) ×2
GOWN SURG XL STD LGTH L3 HKLP CLSR RGLN SLEEVE TWL STRL LF  DISP GRN AERO BLU PRFRM FBRC (DRAPE/PACKS/SHEETS/OR TOWEL) ×2 IMPLANT
GOWN SURG XL STD LGTH L3 NONREINFORCE HKLP CLSR TWL STRL LF (DRAPE/PACKS/SHEETS/OR TOWEL) ×1
GOWN SURG XL STD LGTH L3 NONREINFORCE HKLP CLSR TWL STRL LF  DISP BLU SPECTRUM SMS (DRAPE/PACKS/SHEETS/OR TOWEL) ×1
GOWN SURG XL STD LGTH L3 NONREINFORCE HKLP CLSR TWL STRL LF DISP BLU SPECTRUM SMS (DRAPE/PACKS/SHEETS/OR TOWEL) ×1 IMPLANT
HDPE THK22 UM C40-45 GL L48 IN X W40 IN NATURAL (MISCELLANEOUS PT CARE ITEMS) ×2 IMPLANT
IRR SUCT 10FT STRKFL TUBE 2 SPIKE STRL LF  DISP (ENDOSCOPIC SUPPLIES) ×1 IMPLANT
IRR SUCT 10FT STRKFL TUBE 2 SPIKE STRL LF DISP (INSTRUMENTS ENDOMECHANICAL) ×1
LABEL MED CORRECT MED LABELING SYS 4 FLG 2 SHEET 24 PRPRNT (MED SURG SUPPLIES) ×1
LABEL MED CORRECT MED LABELING SYS 4 FLG 2 SHEET 24 PRPRNT STRL (MED SURG SUPPLIES) ×1 IMPLANT
LINER SUCT MEDIVAC CRD TW LOCK LID SHTOF VALVE CAN PORT 3L LF  DISP (MED SURG SUPPLIES) ×1 IMPLANT
LINER SUCT MEDIVAC CRD TW LOCK_LID SHTOF VALVE CAN PORT 3L (MED SURG SUPPLIES) ×1
NEEDLE 1.5IN 18GA FIL FILTER STRL BLUNT MONOJECT LF  DISP (MED SURG SUPPLIES) ×1
NEEDLE HYPO 22GA 1.5IN STD MONOJECT SS POLYPROP REG BVL LL (MED SURG SUPPLIES) ×1
RELOAD STPLR 2MM 30MM 3-STPL_MED VAS TISS ARTC LF (SUTURE AIDS) ×2
RELOAD STPLR MED 30MM TRI-STPL 2 VAS STRL LF  TAN (SUTURE AIDS) ×2 IMPLANT
SET TUBING PNEUMOCLEAR HIFLO SMOKE EVAC (MED SURG SUPPLIES) ×1 IMPLANT
SET TUBING PNEUMOCLEAR HIFLO S_MOKE EVAC DEMOTE (MED SURG SUPPLIES) ×1
SLEEVE COMPRESS MED KNEE LGTH KENDALL SCD SEQ NONST LF  DISP 21- IN DVT PE (MED SURG SUPPLIES) ×1 IMPLANT
SLEEVE COMPRESS MED KNEE LGTH KENDALL SCD SEQ NONST LF DISP (MED SURG SUPPLIES) ×1
SOL ANFG DFGR ISOPRPNL PAD OVAL BTL NABRSV ADH STRL LF  DISP (ENDOSCOPIC SUPPLIES) ×1 IMPLANT
SOL IRRG 0.9% NACL 1000ML PLASTIC PR BTL PRSV FR DEHP-FR AQLT LF (MEDICATIONS/SOLUTIONS) ×1 IMPLANT
SOL IRRG 0.9% NACL 1000ML PRSV FR DEHP-FR STRL AQLT LF (MEDICATIONS/SOLUTIONS) ×1
SOL IV LR 1000ML N-PYRG FLXB CONTAINR STRL LF (MEDICATIONS/SOLUTIONS) ×1
SOL IV LR 1000ML PRSV FR FLXB CONTAINR LF (MEDICATIONS/SOLUTIONS) ×1 IMPLANT
SOLIDIFY FLUID 1500CC NONST LF  PREM SOLIDIFY + (MED SURG SUPPLIES) ×1 IMPLANT
SOLIDIFY FLUID 1500CC NONST LF PREM SOLIDIFY + (MED SURG SUPPLIES) ×1
SOLUTION ANTI FOG W/SPONGE_280101 DEFOG ENDOMATE 20EA/CS (INSTRUMENTS ENDOMECHANICAL) ×1
SPONGE SURG 4X4IN 16 PLY XRY DETECT COTTON STRL LF  DISP (WOUND CARE SUPPLY) ×1 IMPLANT
SPONGE SURG 4X4IN 16 PLY_RADOPQ COT STRL LF DISP (WOUND CARE/ENTEROSTOMAL SUPPLY) ×1
STAPLER ENDO GIA UNIV 12MM_EGIAUSTND 3EA/BX (SUTURE AIDS) ×1
STAPLER INTERNAL 16CMX4MM PVC STD UNIV TISS STRL LF  DISP EGIA ENDOS 12MM (SUTURE AIDS) ×1 IMPLANT
STRIP SKNCLS MDSTRP FBR NYL POR MED 4X.5IN ADH HYPOALL REINF (SUTURE/WOUND CLOSURE) ×1
SUTURE 0 GS-22 POLYSRB 30IN VIOL BRD COAT ABS (SUTURE/WOUND CLOSURE) ×1
SUTURE 4-0 C-13 BIOSYN 30IN UNDYED MONOF ABS (SUTURE/WOUND CLOSURE) ×4 IMPLANT
SYRINGE LL 10ML LF  STRL GRAD N-PYRG DEHP-FR PVC FREE MED DISP (MED SURG SUPPLIES) ×2 IMPLANT
SYRINGE LL 10ML LF STRL MED D_ISP (MED SURG SUPPLIES) ×2
TOWEL 24X16IN COTTON BLU DISP SURG STRL LF (DRAPE/PACKS/SHEETS/OR TOWEL) ×6 IMPLANT
TROCAR 5MM Z SLEEVE (INSTRUMENTS ENDOMECHANICAL) ×1
TROCAR HERNIA BAL BLUNT 12MM_OMST12BT 5EA/BX (INSTRUMENTS ENDOMECHANICAL) ×1
TROCAR LAPSCP 100MM 5MM KII Z THREAD SLEEVE SHIELD BLADE (INSTRUMENTS ENDOMECHANICAL) ×1
TROCAR LAPSCP 5MM 10MM 12MM ATSUT BLUNT TIP BUIL IN CNVTR BAL OBTURATOR STRL LTX DISP (ENDOSCOPIC SUPPLIES) ×1 IMPLANT
TUBE BUBBLE CONNECTING_8888280214 1EA/BX/CS (MED SURG SUPPLIES) ×1
TUBING SUCT CLR 100FT 3/16IN ARGYLE UNIV PVC NCDTV BBL NONST LF (MED SURG SUPPLIES) ×1 IMPLANT

## 2021-08-26 NOTE — OR Surgeon (Signed)
[facility]      Patient Name: [name], [name] Number: [id number]  Date of Service: 08/26/2021   Date of Birth: January 05, 1991      Pre-Operative Diagnosis: Acute appendicitis     Post-Operative Diagnosis: Acute appendicitis    Procedure(s)/Description:  LAPAROSCOPIC APPENDECTOMY: 36644 (CPT)     Attending Surgeon: Shirlee More, MD     Anesthesia:  CRNA: Donita Brooks, CRNA    Anesthesia Type: .General     Estimated Blood Loss:  <5 cc    Specimens Removed: Appendix    The patient was brought to the operating suite placed in the supine position upon the operating table where anesthesia provided IV sedation followed by endotracheal intubation and general anesthesia.  After induction of general anesthesia, the patient's abdomen was prepped and draped in usual sterile fashion in anticipation of laparoscopic possible open appendectomy.  Attention was first directed to the patient's umbilicus where a small infra-umbilical skin incision was made after local analgesia with dissection down to the abdominal wall fascia where the fascia was incised sharply to allow exposure to the intra-abdominal cavity through this open technique.  A blunt 12 mm balloon tipped trocar was placed through the fascial defect with introduction of a pneumoperitoneum with appropriate intra-abdominal pressures identified.    The patient was placed in Trendelenburg position laterally rotated to the patient's left to allow exposure of the right lower quadrant.  2 other 5 mm ports were then placed under direct visualization to triangulate the right lower quadrant.  With appropriate access provided. the appendix was dissected free from its surrounding adhesions in the right lower quadrant.  The appendix was elevated to allow identification of the base where an avascular plane was created at the base of the appendix where the appendix was transected to the use of a linear stapler with hemostasis insured.  The mesoappendix was then  transected to the use of a linear stapler as well.  The appendix was then removed using an Endo Catch bag through the umbilical port.  The right lower quadrant was then irrigated with copious amounts of irrigation with aspiration clear.  No evidence of bleeding at completion of procedure  After removal of the appendix, the umbilical defect was closed to the use of heavy figure-of-eight Vicryl followed by closure of all skin incisions using subcuticular monofilament observable suture.  Steri-Strips were placed perpendicular to the axis of incision followed by application of sterile pressure dressings.  The patient tolerated the procedure well and was returned to the postanesthesia care unit in stable condition after extubating in the operating suite.  Bridgett Larsson MD MBA CPE FACS

## 2021-08-26 NOTE — ED Nurses Note (Signed)
PATIENT RESTING WITH EYES CLOSED. RESPIRATIONS EVEN, UNLABORED. CAL BELL WITHIN REACH.Marland Kitchen

## 2021-08-26 NOTE — ED Nurses Note (Signed)
PATIENT RESTING WITH EYES CLOSED. RESPIRATIONS EVEN, UNLABORED. CALL BELL WITHIN REACH.
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
## 2021-08-26 NOTE — Anesthesia Transfer of Care (Signed)
ANESTHESIA TRANSFER OF CARE   [name] [name] is a 31 y.o. ,male, Weight: 84.8 kg (187 lb)   had Procedure(s):  LAPAROSCOPIC APPENDECTOMY  performed  08/26/21   Primary Service: Shirlee More, MD    Past Medical History:   Diagnosis Date   . Bradycardia    . Seizure (CMS [facility])       Allergy History as of 08/26/21      No Known Allergies              I completed my transfer of care / handoff to the receiving personnel during which we discussed:  Access, Airway, All key/critical aspects of case discussed, Analgesia, Antibiotics, Expectation of post procedure, Fluids/Product, Gave opportunity for questions and acknowledgement of understanding, Labs and PMHx    Post Location: PACU                                          Additional Info:Report to RN @ Bedside                        Last OR Temp: Temperature: 36.4 C (97.5 F)  ABG:  POTASSIUM   Date Value Ref Range Status   08/25/2021 3.6 3.5 - 5.1 mmol/L Final     KETONES   Date Value Ref Range Status   08/25/2021 Trace Negative, Trace mg/dL Final     CALCIUM   Date Value Ref Range Status   08/25/2021 9.5 8.6 - 10.3 mg/dL Final     Airway:* No LDAs found *  Blood pressure (!) 135/94, pulse 46, temperature 36.4 C (97.5 F), resp. rate 13, height 1.854 m ('6\' 1"'$ ), weight 84.8 kg (187 lb), SpO2 100 %.

## 2021-08-26 NOTE — ED Nurses Note (Signed)
PATIENT SLEEPING. RESPIRATIONS EVEN, UNLABORED. CALL BELL WITHIN REACH.

## 2021-08-26 NOTE — ED Nurses Note (Signed)
PATIENT RESTING WITH EYES CLOSED. RESPIRATIONS EVEN AND UNLABORED. CALL BELL WITHIN REACH.

## 2021-08-26 NOTE — Anesthesia Preprocedure Evaluation (Signed)
ANESTHESIA PRE-OP EVALUATION  Planned Procedure: LAPAROSCOPIC APPENDECTOMY (Abdomen)  Review of Systems                   Pulmonary     Cardiovascular           GI/Hepatic/Renal           Endo/Other         Neuro/Psych/MS    seizures     Cancer                      Physical Assessment      Airway       Mallampati: II      Mouth Opening: good.            Dental           (+) poor dentition           Pulmonary           Cardiovascular             Other findings            Plan  ASA 3     Planned anesthesia type: general     general anesthesia with endotracheal tube intubation                  Rapid sequence induction

## 2021-08-26 NOTE — Discharge Instructions (Signed)
RETURN TO THE OFFICE    IF ANY QUESTIONS OR CONCERNS CALL THE OFFICE    FOLLOW ANY INSTRUCTIONS GIVEN BY DR Brunswick    NOTIFY DR MULLINS OF ANY SIGNS OF INFECTION, TEMP GREATER THA 100.4 ANY FOUL SMELL,ANY UNUSUAL COLOR

## 2021-08-26 NOTE — Anesthesia Postprocedure Evaluation (Signed)
Anesthesia Post Op Evaluation    Patient: [name] [name]  Procedure(s):  LAPAROSCOPIC APPENDECTOMY    Last Vitals:Temperature: 36.8 C (98.2 F) (08/26/21 0900)  Heart Rate: 60 (08/26/21 0900)  BP (Non-Invasive): (!) 143/82 (08/26/21 0900)  Respiratory Rate: 14 (08/26/21 0900)  SpO2: 98 % (08/26/21 0900)    No notable events documented.    Patient is sufficiently recovered from the effects of anesthesia to participate in the evaluation and has returned to their pre-procedure level.  Patient location during evaluation: PACU       Patient participation: complete - patient participated  Level of consciousness: awake and alert and responsive to verbal stimuli    Pain management: adequate  Airway patency: patent    Anesthetic complications: no  Cardiovascular status: acceptable  Respiratory status: acceptable  Hydration status: acceptable  Patient post-procedure temperature: Pt Normothermic   PONV Status: Absent

## 2021-08-27 ENCOUNTER — Telehealth (INDEPENDENT_AMBULATORY_CARE_PROVIDER_SITE_OTHER): Payer: Self-pay | Admitting: Surgery

## 2021-08-27 LAB — SURGICAL PATHOLOGY SPECIMEN

## 2021-08-27 NOTE — Nursing Note (Signed)
Contacted patient today in regards to post operative follow up care, no complaints at this time.

## 2021-09-09 ENCOUNTER — Encounter (INDEPENDENT_AMBULATORY_CARE_PROVIDER_SITE_OTHER): Payer: 59 | Admitting: NURSE PRACTITIONER

## 2021-09-09 ENCOUNTER — Other Ambulatory Visit: Payer: Self-pay

## 2021-09-12 ENCOUNTER — Encounter (INDEPENDENT_AMBULATORY_CARE_PROVIDER_SITE_OTHER): Payer: Self-pay | Admitting: NURSE PRACTITIONER

## 2021-09-12 NOTE — Nursing Note (Signed)
Sent no show letter.  Melissa A Willis

## 2021-09-17 ENCOUNTER — Encounter (INDEPENDENT_AMBULATORY_CARE_PROVIDER_SITE_OTHER): Payer: Self-pay | Admitting: Family Medicine

## 2021-09-17 DIAGNOSIS — F909 Attention-deficit hyperactivity disorder, unspecified type: Secondary | ICD-10-CM

## 2021-09-17 DIAGNOSIS — R569 Unspecified convulsions: Secondary | ICD-10-CM | POA: Insufficient documentation

## 2021-09-17 DIAGNOSIS — F1911 Other psychoactive substance abuse, in remission: Secondary | ICD-10-CM | POA: Insufficient documentation

## 2021-09-17 DIAGNOSIS — E559 Vitamin D deficiency, unspecified: Secondary | ICD-10-CM | POA: Insufficient documentation

## 2021-09-17 HISTORY — DX: Attention-deficit hyperactivity disorder, unspecified type: F90.9

## 2021-09-18 ENCOUNTER — Other Ambulatory Visit (INDEPENDENT_AMBULATORY_CARE_PROVIDER_SITE_OTHER): Payer: Self-pay | Admitting: Neurology

## 2021-09-18 ENCOUNTER — Encounter (INDEPENDENT_AMBULATORY_CARE_PROVIDER_SITE_OTHER): Payer: Self-pay | Admitting: Family Medicine

## 2021-10-07 ENCOUNTER — Encounter (INDEPENDENT_AMBULATORY_CARE_PROVIDER_SITE_OTHER): Payer: Self-pay | Admitting: Student in an Organized Health Care Education/Training Program
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
## 2021-10-11 NOTE — Procedures (Signed)
 EKG completed.   Copy with Unconfirmed diagnosis placed on patient's paper chart for immediate provider review.   Final Interpretation of Original by Dr Renaldo Harrison pending.

## 2021-10-11 NOTE — ED Provider Notes (Signed)
ED Provider Note    Subjective     Patient is a 31 year old male who comes to the ER by private vehicle complaining of chest pain.  Upon evaluation patient states he vaped yesterday was given some type of vape from a coworker.  He then states that he believes they were trying to poison him.  He states since last night he been having midsternal chest pain nonradiating.  Patient denies any shortness of breath, palpitations, nausea, vomiting.  Patient denies any suicidal homicidal ideation.  Denies any hallucinations either auditory or visual.  Patient states he has a past medical history significant for seizure disorder and believes that his seizures have progressively gotten worse and believes this is secondary to someone at work "poisoning him".          Objective     Vitals <redacted file path>:  ED Triage Vitals [10/11/21 2254]   BP Pulse Resp Temp SpO2 Flow (L/min)   (!) 164/94 99 20 99.4 F (37.4 C) 99 % --     Physical Exam  Vitals and nursing note reviewed. Exam conducted with a chaperone present.   Constitutional:       Appearance: Normal appearance.   HENT:      Head: Normocephalic and atraumatic.      Nose: Nose normal.      Mouth/Throat:      Comments: Multiple broken teeth and dental caries  Eyes:      Extraocular Movements: Extraocular movements intact.      Pupils: Pupils are equal, round, and reactive to light.   Cardiovascular:      Rate and Rhythm: Normal rate and regular rhythm.      Pulses: Normal pulses.      Heart sounds: Normal heart sounds.   Pulmonary:      Effort: Pulmonary effort is normal. No respiratory distress.      Breath sounds: Normal breath sounds. No stridor. No wheezing or rhonchi.   Abdominal:      General: Abdomen is flat.      Palpations: Abdomen is soft.   Musculoskeletal:         General: Normal range of motion.      Cervical back: Normal range of motion.   Skin:     General: Skin is warm.      Capillary Refill: Capillary refill takes less than 2 seconds.   Neurological:       Mental Status: He is alert and oriented to person, place, and time.   Psychiatric:         Attention and Perception: He is inattentive.         Mood and Affect: Mood is anxious.         Speech: Speech is rapid and pressured.         Behavior: Behavior is cooperative.         Thought Content: Thought content is paranoid and delusional.      Comments: Patient believes coworkers are trying to poison him.  Patient adamantly denies any suicidal or homicidal ideations.           Assessment and Plan:     Clinical Scoring Tools: <redacted file path>                  Medical Decision Making  Patient is a 31 year old male presents to the ER with atypical chest pain symptoms.  EKG reviewed showed no ST depression or ST elevation.  Patient appears to believe that his  coworkers are trying to poison him.  He states that his symptoms began last night and having chest pains ever since.  Family member brings him to the ER for further evaluation.    Amount and/or Complexity of Data Reviewed  Labs: ordered.  Radiology: ordered.  ECG/medicine tests: ordered.      Risk  OTC drugs.       "Risk" in the MDM section refers to billing criteria on potential for complications and/or morbidity/mortality of management as defined by the AMA and CMS    ED Course <redacted file path>:    10:59 PM patient was seen and examined reviewed previous charting history ordered laboratory studies reviewed EKG which was normal sinus rhythm rightward axis deviation heart rate 81 QT 362 QTc 428.  PR interval is 154.  11:04 PM Patient states that he wishes to speak to police. I asked for [place] [employer] to talk to the patient.   11:24 PM spoke to the patient.  Family is at bedside.  Awaiting laboratory studies and imaging.  12:04 AM reviewed patient's laboratory studies potassium only slightly low.  Troponins negative.  UDS positive for THC.  We will repeat troponin at 1 AM.  12:18 AM patient is oriented to person place and time but does appear to be suffering  from psychosis and paranoia.  Patient denies any suicidal homicidal ideations at great length.  Mother is at bedside as well as girlfriend.  Officer has been in the room with the patient patient does not mention any thoughts to hurt anyone or himself.  I counseled patient at great length the need for mental health.  Patient refused pulled out IV states he feels as though "on the side of those that are trying to kill him".  And wishes to sign out AGAINST MEDICAL ADVICE.        Patient insisted on leaving AMA, Patient talked to at length and told that the stardard of care is       and that by leaving  AGAINST MEDICAL ADVICE he is deviating from stardard of care and that adverse outcome including increased pain, suffering , disability and even death could result. Patient told that we are not mad at him and that he can return at any time if he changes his mind or has increased symptoms or for any problems. Above conversation witnessed by nurse. Patient left against medical advice  Results for orders placed or performed during the hospital encounter of 10/11/21   SARS COV 2, INFLU. A/B, RSV PCR (COFRP)    Specimen: NASOPHARYNX   Result Value Ref Range    Specimen Nasopharynx     SARS-CoV-2 Not Detected Not Detected    Interpretative Comment       This test has been authorized by FDA under an Emergency Use  Authorization (EUA) for use by authorized laboratories. Please review the "Fact  Sheets" for health care providers, and patients and the FDA authorized labeling  available on the [facility] website: www.[facility].com/[email or web address].      Influenza A PCR Not Detected Not Detected    Influenza B PCR Not Detected Not Detected    Respiratory Syncytial Virus PCR Not Detected Not Detected    First test Not given     Employed In Healthcare No     Symptomatic as defined by CDC Not given     Date of symptom onset Not given     Hospitalized Not given     ICU Not given  Resident in a congregate care setting No     Is patient  pregnant Not given     Race White or Caucasian     Ethnicity       Non  Hispanic  Lab studies performed by: [facility] located at [facility], [address]  [place], [place] [address]     CBC WITH AUTO DIFF (CBCD)   Result Value Ref Range    WBC 6.2 4.0 - 10.5 K/uL    RBC 4.83 4.5 - 5.3 M/uL    Hemoglobin 14.2 13.0 - 16.0 g/dL    Hematocrit 32.4 37 - 49 %    MCV 86.3 78 - 98 fL    MCH 29.4 27 - 34.6 pg    MCHC 34.1 33 - 37 g/dL    RDW 40.1 02.7 - 25.3 %    Platelet Count 286 130 - 400 K/uL    MPV 10.1 9.4 - 12.4 fL    Seg 61.9 %    Lymph 28.3 %    Monos 8.8 %    Eos 0.5 %    Baso 0.5 %    Absolute Neut 3.9 1.8 - 7.7 K/uL    Absolute Lymph 1.8 1.0 - 5.0 K/uL    Absolute Mono 0.6 0 - 0.8 K/uL    Absolute Eos 0.0 0 - 0.4 K/uL    Absolute Basophils 0.0 0 - 0.2 K/uL     Comment: Lab studies performed by: [facility] located at [facility], [address]  [place], [place] [address]     COMPREHENSIVE METABOLIC PANEL(COMP)   Result Value Ref Range    Sodium 141 136 - 148 MMOL/L    Potassium 3.3 (L) 3.5 - 5.2 MMOL/L    Chloride 102 98 - 108 MMOL/L    CO2 30 20 - 32 MMOL/L    Urea Nitrogen 9 7 - 23 MG/DL    Creatinine 3.47 4.25 - 1.30 MG/DL     Comment: Creatinine results have been standardized to a reference material that is  traceable to IDMS method.      Glucose, Bld 130 (H) 74 - 106 mg/dL     Comment: Non fasting glucose range:  65-140 mg/dl  Impaired fasting Glucose:  100-125 mg/dL      Total Protein 7.9 5.7 - 8.2 G/DL    Albumin 4.3 3.2 - 5.0 G/DL    Calcium 9.3 8.5 - 95.6 MG/DL    Total Bilirubin 0.3 0.3 - 1.2 MG/DL    Alkaline Phosphatase, Serum 75 46 - 116 U/L    AST 12 (L) 15 - 37 U/L    ALT 19 16 - 63 U/L    Globulin 3.6 1.7 - 3.9 G/DL    Albumin/Globulin 1.2 0.7 - 2.3 RATIO    Anion Gap 9 8 - 18 mmol/L    Osmolality Calc 292 278 - 305 MOS/KG     Comment: Calculated Osmolality formula has been changed to the [place]line-Gardner  Formula: [2(NA)+GLU/18+BUN/2.8]. Please note change in value from the previous  calculated  results.      Bun/Creatinine 9.4 7 - 20    Glom Filt Rate, Estimated >90 >60 ml/min/1.28m2     Comment: (Note)  The estimated GFR is a calculation valid for adults (18 to 33 years  old) that uses the CKD-EPI algorithm to adjust for age and sex. It is  not to be used for children, pregnant women, hospitalized patients,  patients on dialysis, or with rapidly changing kidney function.  According to the NKDEP, eGFR >89 is normal, 60-89 shows mild  impairment, 30-59 shows moderate impairment, 15-29 shows severe  impairment and <15 is ESRD.    Lab studies performed by: [facility] located at [facility], [address] [place], [place] [address]     MAGNESIUM(MG)   Result Value Ref Range    Magnesium 2.2 1.6 - 2.6 MG/DL     Comment: Lab studies performed by: [facility] located at [facility], [name]  [place], [place] [address]     TROPONIN I HIGH SENSITIVITY (TROPHS)   Result Value Ref Range    Troponin HS <4 <57 ng/L     Comment: Lab studies performed by: [facility] located at [facility], [address], [place] [address]     PRO B TYPE NATRIURETIC PEPTIDE (PBNP)   Result Value Ref Range    Pro B Type Natriuretic Peptide 15.0 <125 pg/ml     Comment: Lab studies performed by: [facility] located at [facility], [address] [place] [address]     PROTIME-INR(PT)   Result Value Ref Range    Pt(Patient) 10.5 9.0 - 11.5 SEC     Comment: Reference interval is for nonmedicated patients.    INR 1.0 Ratio     Comment: (Note)  Reference range                       0.9-1.1  Moderate-intensity warfarin therapy   2.0-3.0  High-intensity warfarin therapy       2.5-3.5  Lab studies performed by: [facility] located at [facility], [employer] [address] [place], [place] [address]     APTT(PTT)   Result Value Ref Range    PTT 22.1 22 - 34 SEC     Comment: Lab studies performed by: [facility] located at [facility], [employer] [place], [place] [address]     ALCOHOL [facility])   Result Value Ref Range    Alcohol <0.003 <0.003 % WT/VOL      Comment: Neg  This test is intended only for management of patients. The specimen was  collected and processed with out documentation of the chain of custody. The  test was performed on serum. For information regarding the availability and  ordering of Medicolegal testing for Blood Alcohol, call [facility] or  the Laboratory at your facility.      Collection Tech Id [phone]     Iodine Prep Used? Unknown (A) Yes     Comment: Lab studies performed by: [facility] located at [facility], [address]  [place], [place] [address]     DRUG SCREEN 13, UNCONFIRM URINE (UDS13)([facility]-DO NOT ORDER)   Result Value Ref Range    THC, Urine Pos (A) Neg     Comment: Cutoff Concentration     50 ng/mL    Phencyclidine, Urine Neg Neg     Comment: Cutoff Concentration     25 ng/mL    Cocaine, Urine Neg Neg     Comment: Cutoff Concentration   150 ng/ml    MethamphetAMINE & MDMA,Urine Neg Neg     Comment: Cutoff Concentration   500 ng/ml    Opiates, Urine Neg Neg     Comment: Cutoff Concentration   100 ng/mL    Amphetamine, Urine Neg Neg     Comment: Cutoff Concentration   500 ng/ml    Benzodiazepine, Urine Neg Neg     Comment: Cutoff Concentration  150 ng/ml    Tricyclic Antidepressant, Urine Neg Neg     Comment: Cutoff Concentration   300 ng/mL    Methadone, Urine Neg Neg     Comment: Cutoff Concentration   200 ng/mL    Barbiturates, Urine Neg Neg     Comment: Cutoff Concentration   200 ng/mL    Oxycodone, Urine Neg Neg     Comment: Cutoff Concentration   100 ng/mL    Propoxyphene, Urine Neg Neg     Comment: Cutoff Concentration   300 ng/mL    Buprenorphine, Urine Neg Neg     Comment: Cutoff Concentration     10 ng/mL  (Note)  This drug testing is for medical treatment only.  The results are   presumptive; based only on screening methods, and they have not been   confirmed by a second independent chemical method.  Analysis was   performed as non-forensic testing and these results should be used   only by healthcare providers to  render diagnosis or treatment, or to   monitor progress of medical conditions.  For assistance with   interpreting these drug results, please contact a [employer]   Toxicology Specialist. [phone] RX TOX [phone]), Monday   through Friday, 8am to 6pm EST.  Lab studies performed by: [facility] located at [facility], [address] [place], [place] [address]     [place])   Result Value Ref Range    Specimen Urine, Clean Catch     Appearance, Urine Clear     Color, UA Yellow     Glucose, Ur Neg Neg MG/DL    Ketones, Ua 40 (A) Neg MG/DL    Specific Gravity, Urine 1.020 1.005 - 1.030    Blood, Urine Trace (A) Neg    pH, Urine 6.0 5.0 - 8.0    Protein, Urine Trace (A) Neg mg/dL    Urobilinogen, Ua 1.0 <2.0 EU/dL    Nitrite, Urine Neg Neg    WBC Esterase/Urine Neg Neg    Bilirubin Urine Neg Neg     Comment: Lab studies performed by: [facility] located at [facility], [address]  [place], [place] [address]     ACETAMINOPHEN/TYLENOL (ACETM)   Result Value Ref Range    Acetaminophen 0 (L) 15.0 - 30.0 UG/ML     Comment:    Therapeutic        15-30ug/mL     Toxic                         4hrs Post                          Ingestion    >150ug/mL                                   8hrs Post                           Ingestion    >75ug/mL                                   12hrs Post  Ingestion    >40ug/mL  Lab studies performed by: [facility] located at [facility], [address]  [place], [place] [address]     SALICYLATE (SCYL)   Result Value Ref Range    Salicylate <3.0 2.8 - 20.0 MG/DL     Comment: Lab studies performed by: [employer] [facility] located at [facility], [address]  [place], [place] [address]     URINALYSIS, MICROSCOPIC (UMIC)   Result Value Ref Range    RBC, Urine 3-5 0 - 2 /HPF    WBC, Urine None seen 0 - 5 /HPF    Squam Epithel, Ua Few /LPF     Comment: (NSN to FEW)    Mucus Few (A) None seen /LPF    Bacteria Rare (A) None seen /HPF     Comment: Lab studies performed by: [employer] located at [facility], [address]  [place], [place] [address]       Procedures    Patient Vitals for the past 24 hrs:   BP Temp Pulse Resp SpO2 Height Weight   10/11/21 2254 (!) 164/94 99.4 F (37.4 C) 99 20 99 % 1.829 m (6') 81.6 kg (180 lb)     Clinical Impression <redacted file path>:  1. Palpitations    2. Paranoia (HCC)       Condition: Other stable  Disposition <redacted file path>: ama

## 2021-10-12 ENCOUNTER — Emergency Department
Admission: EM | Admit: 2021-10-12 | Discharge: 2021-10-12 | Discharge status: Against Medical Advice | Payer: 59 | Attending: Family | Admitting: Family

## 2021-10-12 ENCOUNTER — Other Ambulatory Visit: Payer: Self-pay

## 2021-10-12 ENCOUNTER — Encounter (HOSPITAL_COMMUNITY): Payer: Self-pay | Admitting: Family

## 2021-10-12 ENCOUNTER — Emergency Department
Admission: EM | Admit: 2021-10-12 | Discharge: 2021-10-13 | Disposition: A | Discharge status: Home / Self Care | Payer: 59 | Attending: Emergency Medicine | Admitting: Emergency Medicine

## 2021-10-12 DIAGNOSIS — F909 Attention-deficit hyperactivity disorder, unspecified type: Secondary | ICD-10-CM

## 2021-10-12 DIAGNOSIS — Z5329 Procedure and treatment not carried out because of patient's decision for other reasons: Secondary | ICD-10-CM | POA: Insufficient documentation

## 2021-10-12 DIAGNOSIS — F600 Paranoid personality disorder: Secondary | ICD-10-CM | POA: Insufficient documentation

## 2021-10-12 DIAGNOSIS — F23 Brief psychotic disorder: Secondary | ICD-10-CM | POA: Insufficient documentation

## 2021-10-12 DIAGNOSIS — Z20822 Contact with and (suspected) exposure to covid-19: Secondary | ICD-10-CM

## 2021-10-12 DIAGNOSIS — G40909 Epilepsy, unspecified, not intractable, without status epilepticus: Secondary | ICD-10-CM | POA: Insufficient documentation

## 2021-10-12 DIAGNOSIS — F419 Anxiety disorder, unspecified: Secondary | ICD-10-CM | POA: Insufficient documentation

## 2021-10-12 DIAGNOSIS — F22 Delusional disorders: Secondary | ICD-10-CM

## 2021-10-12 LAB — MAGNESIUM: MAGNESIUM: 2.2 mg/dL (ref 1.9–2.7)

## 2021-10-12 LAB — CBC WITH DIFF
BASOPHIL #: 0 10*3/uL (ref 0.00–0.30)
BASOPHIL %: 0 % (ref 0–3)
EOSINOPHIL #: 0 10*3/uL (ref 0.00–0.80)
EOSINOPHIL %: 0 % (ref 0–7)
HCT: 41.6 % — ABNORMAL LOW (ref 42.0–51.0)
HGB: 14.8 g/dL (ref 13.5–18.0)
LYMPHOCYTE #: 2 10*3/uL (ref 1.10–5.00)
LYMPHOCYTE %: 20 % — ABNORMAL LOW (ref 25–45)
MCH: 30.6 pg (ref 27.0–32.0)
MCHC: 35.6 g/dL (ref 32.0–36.0)
MCV: 85.9 fL (ref 78.0–99.0)
MONOCYTE #: 0.6 10*3/uL (ref 0.00–1.30)
MONOCYTE %: 7 % (ref 0–12)
MPV: 8.1 fL (ref 7.4–10.4)
NEUTROPHIL #: 7.2 10*3/uL (ref 1.80–8.40)
NEUTROPHIL %: 73 % (ref 40–76)
PLATELETS: 264 10*3/uL (ref 140–440)
RBC: 4.84 10*6/uL (ref 4.20–6.00)
RDW: 13.1 % (ref 11.6–14.8)
WBC: 9.9 10*3/uL (ref 4.0–10.5)
WBCS UNCORRECTED: 9.9 10*3/uL

## 2021-10-12 LAB — URINE DRUG SCREEN
AMPHET QL: NEGATIVE
BARB QL: NEGATIVE
BENZO QL: NEGATIVE
BUP QL: NEGATIVE
CANNAQL: POSITIVE — AB
COCQL: NEGATIVE
METHQL: NEGATIVE
OPIATE: NEGATIVE
OXYCODONE URINE: NEGATIVE
PCP QL: NEGATIVE
TRICYCLIC ANTIDEPRESSANTS URINE: NEGATIVE

## 2021-10-12 LAB — THYROID STIMULATING HORMONE (SENSITIVE TSH): TSH: 1.667 u[IU]/mL (ref 0.450–5.330)

## 2021-10-12 LAB — COMPREHENSIVE METABOLIC PANEL, NON-FASTING
ALBUMIN/GLOBULIN RATIO: 2 — ABNORMAL HIGH (ref 0.8–1.4)
ALBUMIN: 5.1 g/dL (ref 3.5–5.7)
ALKALINE PHOSPHATASE: 61 U/L (ref 34–104)
ALT (SGPT): 12 U/L (ref 7–52)
ANION GAP: 9 mmol/L — ABNORMAL LOW (ref 10–20)
AST (SGOT): 15 U/L (ref 13–39)
BILIRUBIN TOTAL: 0.5 mg/dL (ref 0.3–1.2)
BUN/CREA RATIO: 14 (ref 6–22)
BUN: 13 mg/dL (ref 7–25)
CALCIUM, CORRECTED: 8.7 mg/dL — ABNORMAL LOW (ref 8.9–10.8)
CALCIUM: 9.8 mg/dL (ref 8.6–10.3)
CHLORIDE: 104 mmol/L (ref 98–107)
CO2 TOTAL: 26 mmol/L (ref 21–31)
CREATININE: 0.96 mg/dL (ref 0.60–1.30)
ESTIMATED GFR: 108 mL/min/{1.73_m2} (ref >59)
GLOBULIN: 2.6 — ABNORMAL LOW (ref 2.9–5.4)
GLUCOSE: 86 mg/dL (ref 74–109)
OSMOLALITY, CALCULATED: 277 mOsm/kg (ref 270–290)
POTASSIUM: 3.8 mmol/L (ref 3.5–5.1)
PROTEIN TOTAL: 7.7 g/dL (ref 6.4–8.9)
SODIUM: 139 mmol/L (ref 136–145)

## 2021-10-12 LAB — URINALYSIS, MACROSCOPIC
BILIRUBIN: NEGATIVE mg/dL
BLOOD: 0.03 mg/dL
GLUCOSE: NEGATIVE mg/dL
KETONES: 40 mg/dL — AB
LEUKOCYTES: NEGATIVE WBCs/uL
NITRITE: NEGATIVE
PH: 6 (ref 5.0–9.0)
PROTEIN: 70 mg/dL — AB
SPECIFIC GRAVITY: 1.031 — ABNORMAL HIGH (ref 1.002–1.030)
UROBILINOGEN: 2 mg/dL — AB

## 2021-10-12 LAB — URINALYSIS, MICROSCOPIC
HYALINE CASTS: 5 /lpf — ABNORMAL HIGH (ref <0)
RBCS: 14 /hpf — ABNORMAL HIGH (ref <4)
SQUAMOUS EPITHELIAL: <1 /hpf (ref <28)
WBCS: 1 /hpf (ref <6)

## 2021-10-12 LAB — SALICYLATE ACID LEVEL: SALICYLATE LEVEL: <3 mg/dL (ref <=30)

## 2021-10-12 LAB — COVID-19, FLU A/B, RSV RAPID BY PCR
INFLUENZA VIRUS TYPE A: NOT DETECTED
INFLUENZA VIRUS TYPE B: NOT DETECTED
RESPIRATORY SYNCTIAL VIRUS (RSV): NOT DETECTED
SARS-CoV-2: NOT DETECTED

## 2021-10-12 LAB — AMMONIA: AMMONIA: 40 umol/L (ref 16–53)

## 2021-10-12 LAB — ACETAMINOPHEN LEVEL: ACETAMINOPHEN LEVEL: <10 ug/mL — ABNORMAL LOW (ref 10–30)

## 2021-10-12 LAB — ETHANOL, SERUM/PLASMA: ETHANOL: <10 mg/dL — ABNORMAL HIGH

## 2021-10-12 MED ORDER — LORAZEPAM 2 MG/ML INJECTION SYRINGE
INJECTION | INTRAMUSCULAR | Status: AC
Start: 2021-10-12 — End: 2021-10-12
  Filled 2021-10-12: qty 1

## 2021-10-12 MED ORDER — ZIPRASIDONE 20 MG/ML (FINAL CONCENTRATION) INTRAMUSCULAR SOLUTION
INTRAMUSCULAR | Status: AC
Start: 2021-10-12 — End: 2021-10-12
  Filled 2021-10-12: qty 1

## 2021-10-12 MED ORDER — ZIPRASIDONE 20 MG/ML (FINAL CONCENTRATION) INTRAMUSCULAR SOLUTION
20.0000 mg | Freq: Once | INTRAMUSCULAR | Status: AC
Start: 2021-10-12 — End: 2021-10-12
  Administered 2021-10-12: 20 mg via INTRAMUSCULAR

## 2021-10-12 MED ORDER — LORAZEPAM 2 MG/ML INJECTION WRAPPER
2.0000 mg | INTRAMUSCULAR | Status: AC
Start: 2021-10-12 — End: 2021-10-12
  Administered 2021-10-12: 2 mg via INTRAMUSCULAR

## 2021-10-12 NOTE — ED Triage Notes (Signed)
Family reports that pt has been acting erratic, reports that he has been repeating that he has informed on a drug dealer and that people are out to get him and are poisoning, thinks if he stops moving, his heart will stop, pt continuing to pace room stating that he has worms coming out of different parts of his body. Pt denies any drug use.     BWVRS: Bls transport, 4 lead

## 2021-10-12 NOTE — ED APP Handoff Note (Signed)
[facility]  Emergency Department  Provider in Triage Note    Name: [name] [name]  Age: 31 y.o.  Gender: male     Subjective:   [name] [name] is a 31 y.o. male who presents with complaint of No chief complaint on file.  Marland Kitchen  Pt presents to the ER for evaluation stating he thinks he is being poisoned and states he has the poisoned stuff from [employer]. 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 He states last night he was seen at [facility]. He states he feels someone from [employer] is trying to kill him. He states his s/s are pain in his abdomen and chest.     Objective:   There were no vitals filed for this visit.   Focused Physical Exam shows Paranoid, Anxious.     Assessment:  A medical screening exam was completed.  This patient is a 31 y.o. male with initial findings showing Paranoia, Anxious.     Plan:  Please see initial orders and work-up below.  This is to be continued with full evaluation in the main Emergency Department.     No current facility-administered medications for this encounter.     No results found for this or any previous visit (from the past 24 hour(s)).     Benard Rink, APRN  10/12/2021, 16:23

## 2021-10-12 NOTE — ED Provider Notes (Addendum)
[facility]  ED Primary Provider Note  Patient Name: [name] [name]  Patient Age: 31 y.o.  Date of Birth: 05-19-1990    Chief Complaint: Hallucinations and Paranoid        History of Present Illness       Reuven Braver is a 31 y.o. male who had concerns including Hallucinations and Paranoid.       Chief complaint agitation.      HPI the patient has been acting very erratic for the last 2-3 days.  He was here at the hospital earlier today and left without being seen.  He is very paranoid and states that he has been poisoned from the [facility] dispensary here in [place].  He finally made it to [facility] where he was brought in by EMS with the assistance of the state police.  It took quite a bit of time to get the patient into the facility because he is so paranoid.  He continues to talk about people who are after him and are trying to poison him and kill him.  Context:  The wife is currently filling out committal papers for his safety.    Onset acute  Timing 2-3 days   Duration constant getting worse   Mechanism:  The patient has never had any severe psychosis.  He does have ADHD in the past.  He denies any drug abuse in the mother confirms this.  Additional symptoms:  None at this time.  The patient refuses to talk.  He does have tachycardia in route.  In route his heart rate was 150 beats per minute.    Additional information:  Patient was seen yesterday at [facility]  ER for the same thing. He signed out AMA at that time.  Social History: Reviewed and listed below    Past Medical History:Reviewed and listed in the chart below.      Past Surgical History: Reviewed and listed on the chart below.      Family History: Reviewed and listed below    Allergies: Reviewed and listed on the chart    Medications: Reviewed and listed on the chart.    The nursing notes were reviewed and agreed upon unless otherwise stated    The vital signs were reviewed and are listed on the chart    Review of Systems        ROS: No other overt Review of Systems are noted to be positive except noted in the HPI.        Physical Exam   ED Triage Vitals [10/12/21 1821]   BP (Non-Invasive) (!) 145/100   Heart Rate (!) 148   Respiratory Rate (!) 22   Temp    SpO2 98 %   Weight 79.4 kg (175 lb)   Height 1.854 m ('6\' 1"' )         Physical Exam   Constitutional/general:     31 year old disheveled and agitated male.  He walks in of his own volition assisted by 3 state police officers.  The patient had significant difficulty getting into our facility because he was so paranoid.  He appears that he has not slept in more than 3 or 4 days .  He is currently shirtless with shorts on.  HEENT: Eyes show normal extraocular movements.  Well-hydrated oral mucosa is noted.  There is no facial trauma or abnormalities.  Neck: No midline tenderness, no meningeal signs.  Full range of motion.  No JVD.  Cardiovascular: Heart is regular  rate and rhythm S1-S2 sounds were auscultated without murmur click or rub  Respiratory: Lungs are clear to auscultation in all fields without wheeze rale or rhonchi.  GI: Abdomen is soft non tender normal bowel sounds are auscultated.  There is no rebound tenderness or guarding  Neuro cranial nerves II through XII are intact and normal.  Patient has normal speech and normal gait.  There is no muscle weakness in any extremities.  Sensation is intact throughout.    Psych:  Patient displays severe paranoia.  He has broken speech patterns and flights of ideas.  The patient appears to be significantly frightened at this time.  He can not make his own decisions at this time due to psychiatric agitation.   Skin: No rash.  No petechiae or purpura.  The skin is warm and dry without diaphoresis.  There is no pallor.  Musculoskeletal: There is no tenderness to palpation in any body region.  There is no lower extremity edema and no calf tenderness.  Negative Homans sign bilaterally.          Patient Data     Labs Ordered/Reviewed    ACETAMINOPHEN LEVEL - Abnormal; Notable for the following components:       Result Value    ACETAMINOPHEN LEVEL <10 (*)     All other components within normal limits   COMPREHENSIVE METABOLIC PANEL, NON-FASTING - Abnormal; Notable for the following components:    ANION GAP 9 (*)     ALBUMIN/GLOBULIN RATIO 2.0 (*)     CALCIUM, CORRECTED 8.7 (*)     GLOBULIN 2.6 (*)     All other components within normal limits    Narrative:     Estimated Glomerular Filtration Rate (eGFR) is calculated using the CKD-EPI (2021) equation, intended for patients 17 years of age and older. If gender is not documented or "unknown", there will be no eGFR calculation.   ETHANOL, SERUM - Abnormal; Notable for the following components:    ETHANOL <10 (*)     All other components within normal limits   URINE DRUG SCREEN - Abnormal; Notable for the following components:    CANNAQL Positive (*)     All other components within normal limits    Narrative:     Any results reported as "detected" on this urine drug screen are unconfirmed screening results and should be used only for medical (i.e., treatment)purposes only. Unconfirmed screening results must not be used for non-medical purposes (e.g. employment or legal testing). Upon request, all results reported as "detected" can be sent to a reference laboratory for confirmation by [place].     Reporting Limits (cut-off concentrations)     Concaine 300 ng/mL  Opiates 300 ng/mL  THC 50 ng/mL  Amphetamine 1000 ng/mL  Phencyclidine 25 ng/mL  Benzodiazepine 300 ng/mL  Barbiturates 300 ng/mL  Methadone 300 ng/mL  Oxycodone 100 ng/mL  Buprenorphine 5 ng/mL   CBC WITH DIFF - Abnormal; Notable for the following components:    HCT 41.6 (*)     LYMPHOCYTE % 20 (*)     All other components within normal limits   URINALYSIS, MACROSCOPIC - Abnormal; Notable for the following components:    SPECIFIC GRAVITY 1.031 (*)     PROTEIN 70 (*)     KETONES 40 (*)     UROBILINOGEN 2 (*)     All other components within normal  limits   URINALYSIS, MICROSCOPIC - Abnormal; Notable for the following components:    MUCOUS Moderate (*)  RBCS 14 (*)     HYALINE CASTS 5 (*)     All other components within normal limits   AMMONIA - Normal   MAGNESIUM - Normal   SALICYLATE ACID LEVEL - Normal   THYROID STIMULATING HORMONE (SENSITIVE TSH) - Normal   CBC/DIFF    Narrative:     The following orders were created for panel order CBC/DIFF.  Procedure                               Abnormality         Status                     ---------                               -----------         ------                     CBC WITH DIFF[[id number]]                Abnormal            Final result                 Please view results for these tests on the individual orders.   URINALYSIS, MACROSCOPIC AND MICROSCOPIC W/CULTURE REFLEX    Narrative:     The following orders were created for panel order URINALYSIS, MACROSCOPIC AND MICROSCOPIC W/CULTURE REFLEX.  Procedure                               Abnormality         Status                     ---------                               -----------         ------                     URINALYSIS, MACROSCOPIC[[id number]]      Abnormal            Final result               URINALYSIS, MICROSCOPIC[[id number]]      Abnormal            Final result                 Please view results for these tests on the individual orders.   COVID-19, FLU A/B, RSV RAPID BY PCR     EKG is interpreted by me at 8:43 p.m..  It will be reviewed by cardiologist later time.    Vertical axis.  Normal R-wave progression across the anterior precordial leads.  Normal PR and QT/QTC interval.  J-point elevation is noted in the inferior lateral leads consistent with early repolarization.  This represents a young healthy heart a 31 years old.  There are no true ST elevations and there are no reciprocal changes.  Normal sinus rhythm 95 beats per minute.    No orders to display       Medical Decision Making  Medical Decision Making  Acute psychosis (CMS  [facility]): acute illness or injury  ADHD (attention deficit hyperactivity disorder): chronic illness or injury  Seizure disorder (CMS [facility]): chronic illness or injury  Amount and/or Complexity of Data Reviewed  Independent Historian: parent     Details: Police    Labs: ordered. Decision-making details documented in ED Course.  ECG/medicine tests: ordered. Decision-making details documented in ED Course.     Details: EKG is interpreted by me at 8:43 p.m..  It will be reviewed by cardiologist later time.    Vertical axis.  Normal R-wave progression across the anterior precordial leads.  Normal PR and QT/QTC interval.  J-point elevation is noted in the inferior lateral leads consistent with early repolarization.  This represents a young healthy heart a 31 years old.  There are no true ST elevations and there are no reciprocal changes.  Normal sinus rhythm 95 beats per minute.                       Medications Administered in the ED   ziprasidone (GEODON) IM injection (20 mg IntraMUSCULAR Given 10/12/21 1828)   LORazepam (ATIVAN) 2 mg/mL injection (2 mg IntraMUSCULAR Given 10/12/21 1832)                    Clinical Impression   Acute psychosis (CMS HCC) (Primary)   ADHD (attention deficit hyperactivity disorder)   Seizure disorder (CMS [facility])         Current Discharge Medication List          Psych Facility        _______________________________  Alphonzo Cruise, D.O.  Emergency Medicine   [facility]       At 12 midnight, the hearing has not occurred yet.  The patient is being endorsed to Dr. Hyacinth Meeker at 12 midnight.  Anticipation is admission to a psychiatric facility after he is committed.

## 2021-10-12 NOTE — ED Nurses Note (Signed)
Search completed by security staff. No belongings found.

## 2021-10-12 NOTE — ED Triage Notes (Signed)
In the middle of being triaged patient got up and ran out and stated "he had to call the cops." Security followed. Pt is not a hold at this time.

## 2021-10-12 NOTE — ED Triage Notes (Signed)
Pt states that he bought something from the "[facility] dispens[facility]" and believes that someone there has "Poisoned him." Pt reports he went to [place] last night. Pt states that he currently sees "the people here are after him and he needs security."

## 2021-10-12 NOTE — ED Provider Notes (Signed)
[facility]  ED Primary Provider Note  History of Present Illness   Chief Complaint   Patient presents with   . Hallucinations   . Chest Pain      Arrival: The patient arrived by Car    [name] [name] is a 31 y.o. male who had concerns including Hallucinations and Chest Pain . Pt presents to the ER for evaluation stating he thinks he is being poisoned and states he has the poisoned stuff from [employer]. 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 He states last night he was seen at [facility] and [facility]. He states he feels someone from [employer] is trying to kill him. He states his s/s are pain in his abdomen and chest.     Review of Systems   All other systems reviewed and are negative except as noted    Historical Data   History Reviewed This Encounter: Reviewed    Physical Exam   ED Triage Vitals [10/12/21 1627]   BP (Non-Invasive) (!) 126/114   Heart Rate (!) 123   Respiratory Rate 20   Temperature 37.9 C (100.3 F)   SpO2 100 %   Weight    Height        Constitutional:  31 y.o. male who appears in no distress. Normal color, no cyanosis.  PT appears anxious and restless.   HENT:   Head: Normocephalic and atraumatic.   Mouth/Throat: Oropharynx is clear and moist.   Eyes: EOMI, PERRL   Neck: Trachea midline. Neck supple.  Pulmonary/Chest: Resp even and nonlabored. No respiratory distress.   Psychiatric: Anxious mood and affect. Behavior is agitated. Restless, paranoid.   Neurological: Patient keenly alert and responsive, easily able to raise eyebrows, facial muscles/expressions symmetric, speaking in fluent sentences, moving all extremities equally and fully, normal gait  Patient Data   Labs Ordered/Reviewed - No data to display  No orders to display     Medical Decision Making        Medical Decision Making  Patient appeared paranoid, he walked out of the emergency room and states he was going somewhere else. Attempts were made to try to get  the patient to be seen and he walked out despite requests. Security did attempt to assist with pt.  Triage nurse did call police for the patient's elopement due to his behaviors.     Amount and/or Complexity of Data Reviewed  Labs: ordered.  ECG/medicine tests: ordered.                    Clinical Impression   Paranoid ideation (CMS HCC) (Primary)   Anxious mood       Disposition: [facility]

## 2021-10-13 LAB — ECG 12 LEAD
Atrial Rate: 96 {beats}/min
Calculated P Axis: 62 degrees
Calculated R Axis: 82 degrees
Calculated T Axis: 53 degrees
PR Interval: 142 ms
QRS Duration: 92 ms
QT Interval: 356 ms
QTC Calculation: 449 ms
Ventricular rate: 96 {beats}/min

## 2021-10-13 MED ORDER — CARBAMAZEPINE 100 MG CHEWABLE TABLET
CHEWABLE_TABLET | ORAL | Status: AC
Start: 2021-10-13 — End: 2021-10-13
  Filled 2021-10-13: qty 1

## 2021-10-13 MED ORDER — CARBAMAZEPINE 200 MG TABLET
200.0000 mg | ORAL_TABLET | ORAL | Status: AC
Start: 2021-10-13 — End: 2021-10-13
  Administered 2021-10-13: 200 mg via ORAL
  Filled 2021-10-13: qty 1

## 2021-10-13 NOTE — ED Nurses Note (Signed)
Pt continues to attempt to leave and stating he is seeing a "dark skinned male" in his room that only he is still able to see. Reassured patient of room being secure. Pt agitated requesting new room. Security at bedside. Sitter at bedside.

## 2021-10-13 NOTE — ED Nurses Note (Signed)
Cell phone given to pt wife .

## 2021-10-13 NOTE — ED Nurses Note (Signed)
Pt moved to ER 16 dt increased agitation and aggressive behaviors. Pt continues to voice statements of being watched and being fearful of persons not present. Pt is in blue paper scrubs, hospital socks. Pt ambulatory into room.

## 2021-10-13 NOTE — ED Nurses Note (Signed)
Provider ordered seclusion order for safety of patient and staff d/t increased flight risk and agitation with statements of "hes going to roll up in here in that black escalate you will see". Continues to report man is entering his room harassing him. Video monitoring remains in use, 1:1 sitter at bedside. Door secured by security staff at this time.

## 2021-10-13 NOTE — ED Nurses Note (Signed)
Patient given PO liquids at this time. Patient is calm and cooperative. Patient denies any other needs at this time. Will continue to monitor.

## 2021-10-13 NOTE — ED Attending Handoff Note (Signed)
[facility]  Emergency Department  Course Note    Care/report received from [place] at  07:30.  Per report:  [name] [name] is a 31 y.o. male who had concerns including Hallucinations and Paranoid.             MD TO SEE PT WITHIN 1 HOUR OF RESTRAINT INITIATION:   Patient was secured in restraints at [place].  I saw patient prior to this.  When I saw the patient they were acting very paranoid, in the hallway, and difficult to redirect at times.  The patient reacted to the intervention of restraint by state calm.  The patient's medical condition is clinical psychosis/paranoia.  The patient's behavioral condition is stabilized    In my assessment, I have determined that There is a need to continue restraints.    On re-evaluation, the patient's continually paranoid.  Seclusion reordered.      After involuntary committal hearing, the patient will be transferred to [facility] with the [facility] office.    Course:   ED Course as of 10/13/21 1312   Mon Oct 13, 2021   0739 This patient was received in sign-out from Dr. Sabra Heck approximally 0730.  This patient has a hold order for involuntary committal hearing in the setting of clinical paranoia.  On evaluating the patient after physician handoff, the patient states that he has a "$10,000 hit,"out on him.  He states he needs to get out of the hospital.  The patient is taking video from his room which appears to be a Facebook life feed of people he is paranoid about including "Flack,"who is down the hall.  Charge nurse made aware, security removed the telephone, multiple nurses and security her portions of the conversation including the patient discussing "Flack." The patient, although clinically appears overtly paranoid, and potentially psychotic, is polite and cooperative.   3570 Patient was noted to be wandering the hallways requiring security to direct the patient back to the room.   1010 Staff arriving for committal hearing.   1310  Patient re-evaluated, continued paranoia is obvious..     Based on the history, physical exam, and tests obtained as indicated, the Emergency Department evaluation has identified no acute or active medical issues that warrant further emergency department intervention or precludes discharge of the patient in the company of law enforcement or transfer to a psychiatric facility.    Disposition: Psych Facility            Clinical Impression   Acute psychosis (CMS HCC) (Primary)   ADHD (attention deficit hyperactivity disorder)   Seizure disorder (CMS HCC)         Vonda Antigua, MD

## 2021-10-13 NOTE — ED Nurses Note (Signed)
Pt increased agitation escalated and ran from room into registration area. Security escorted patient back to room. Pt in room on personal phone videoing "live to facebook" of persons in room that only he is able to see in video. Phone removed by security staff.

## 2021-10-13 NOTE — ED Nurses Note (Signed)
Security at bedside at this time for medical hearing. Pt noted to be anxious and agitated.

## 2021-10-14 ENCOUNTER — Encounter (INDEPENDENT_AMBULATORY_CARE_PROVIDER_SITE_OTHER): Payer: 59 | Admitting: Student in an Organized Health Care Education/Training Program

## 2021-10-17 ENCOUNTER — Ambulatory Visit (INDEPENDENT_AMBULATORY_CARE_PROVIDER_SITE_OTHER): Payer: Self-pay | Admitting: Neurology

## 2021-10-17 NOTE — Telephone Encounter (Signed)
They would need to order it there, correct?

## 2021-10-17 NOTE — Telephone Encounter (Signed)
Regarding: [name] [name]  ----- Message from Ramond Marrow, [facility] sent at 10/17/2021 10:48 AM EDT -----  Mother calling on patient, he's at [facility] in [place] with mental hygiene hold - states he's been poisoned. Mother requesting to have MRI ordered .

## 2021-10-24 NOTE — Telephone Encounter (Signed)
[name] Idler, MD   That would be my impression

## 2021-10-24 NOTE — Telephone Encounter (Signed)
Called [place] and lmom

## 2021-11-04 ENCOUNTER — Telehealth: Payer: 59 | Admitting: Student in an Organized Health Care Education/Training Program

## 2021-11-04 ENCOUNTER — Other Ambulatory Visit: Payer: Self-pay

## 2021-11-04 ENCOUNTER — Encounter (INDEPENDENT_AMBULATORY_CARE_PROVIDER_SITE_OTHER): Payer: Self-pay | Admitting: Student in an Organized Health Care Education/Training Program

## 2021-11-04 VITALS — BP 133/76 | HR 74 | Temp 98.9°F | Ht 72.99 in | Wt 176.6 lb

## 2021-11-04 DIAGNOSIS — R9089 Other abnormal findings on diagnostic imaging of central nervous system: Secondary | ICD-10-CM

## 2021-11-04 DIAGNOSIS — G40909 Epilepsy, unspecified, not intractable, without status epilepticus: Secondary | ICD-10-CM
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
## 2021-11-04 NOTE — Patient Instructions (Signed)
We will add lacosamide to your seizure medications as follows:    Week 1:  Morning: Carbamazepine XR 200 mg, vimpat 50 mg  Evening: Carbamazepine XR 200 mg, vimpat 50 mg    Week 2:  Morning: Carbamazepine XR 200 mg, vimpat 50 mg  Evening: Carbamazepine XR 200 mg, vimpat 100 mg    Week 3:   Morning: Carbamazepine XR 200 mg, vimpat 100 mg  Evening: Carbamazepine XR 200 mg, vimpat 100 mg  Continue this regimen    Please let me know of any side effects. Most common ones are drowsiness and dizziness.    Alyson Locket, MD

## 2021-11-05 ENCOUNTER — Ambulatory Visit (INDEPENDENT_AMBULATORY_CARE_PROVIDER_SITE_OTHER): Payer: Self-pay | Admitting: Student in an Organized Health Care Education/Training Program

## 2021-11-05 NOTE — Telephone Encounter (Signed)
I spoke w/Brianna-wife and advised of appt, date, time, and location.  Brianna-wife verbalized understanding.    Thank you + Kind Gus Rankin, CMA-Neurosurgery [phone]

## 2021-11-10 ENCOUNTER — Ambulatory Visit (INDEPENDENT_AMBULATORY_CARE_PROVIDER_SITE_OTHER): Payer: Self-pay | Admitting: Student in an Organized Health Care Education/Training Program

## 2021-11-10 MED ORDER — LACOSAMIDE 100 MG TABLET
ORAL_TABLET | ORAL | 1 refills | Status: DC
Start: 2021-11-10 — End: 2022-01-06

## 2021-11-10 NOTE — Telephone Encounter (Signed)
Regarding: Frey  ----- Message from Ramond Marrow, [place] sent at 11/10/2021 10:45 AM EDT -----  Patient mother call, states she thinks he's supposed to have an MRI done - states that he was supposed to have it done because he had so many seizures he went to psychosis, also states his new seizure med was not called in. Please advise.

## 2021-11-11 ENCOUNTER — Encounter (INDEPENDENT_AMBULATORY_CARE_PROVIDER_SITE_OTHER): Payer: Self-pay | Admitting: Student in an Organized Health Care Education/Training Program

## 2021-11-13 ENCOUNTER — Inpatient Hospital Stay (HOSPITAL_BASED_OUTPATIENT_CLINIC_OR_DEPARTMENT_OTHER)
Admission: RE | Admit: 2021-11-13 | Discharge: 2021-11-13 | Disposition: A | Discharge status: Home / Self Care | Payer: 59 | Source: Ambulatory Visit

## 2021-11-13 ENCOUNTER — Encounter (INDEPENDENT_AMBULATORY_CARE_PROVIDER_SITE_OTHER): Payer: Self-pay | Admitting: Student in an Organized Health Care Education/Training Program

## 2021-11-13 ENCOUNTER — Inpatient Hospital Stay (HOSPITAL_COMMUNITY)
Admission: RE | Admit: 2021-11-13 | Discharge: 2021-11-13 | Disposition: A | Discharge status: Home / Self Care | Payer: 59 | Source: Ambulatory Visit | Admitting: NURSE PRACTITIONER

## 2021-11-13 ENCOUNTER — Other Ambulatory Visit: Payer: Self-pay

## 2021-11-13 ENCOUNTER — Encounter (HOSPITAL_COMMUNITY): Payer: Self-pay

## 2021-11-13 ENCOUNTER — Ambulatory Visit
Payer: 59 | Attending: Student in an Organized Health Care Education/Training Program | Admitting: Student in an Organized Health Care Education/Training Program

## 2021-11-13 ENCOUNTER — Other Ambulatory Visit (INDEPENDENT_AMBULATORY_CARE_PROVIDER_SITE_OTHER): Payer: 59 | Admitting: Rheumatology

## 2021-11-13 VITALS — BP 143/72 | HR 82 | Temp 98.7°F | Ht 72.0 in | Wt 182.1 lb

## 2021-11-13 DIAGNOSIS — D332 Benign neoplasm of brain, unspecified: Secondary | ICD-10-CM

## 2021-11-13 DIAGNOSIS — R9089 Other abnormal findings on diagnostic imaging of central nervous system: Secondary | ICD-10-CM | POA: Insufficient documentation

## 2021-11-13 DIAGNOSIS — G40802 Other epilepsy, not intractable, without status epilepticus: Secondary | ICD-10-CM

## 2021-11-13 DIAGNOSIS — R569 Unspecified convulsions: Secondary | ICD-10-CM

## 2021-11-13 DIAGNOSIS — G40909 Epilepsy, unspecified, not intractable, without status epilepticus: Secondary | ICD-10-CM

## 2021-11-13 DIAGNOSIS — R9082 White matter disease, unspecified: Secondary | ICD-10-CM

## 2021-11-13 HISTORY — DX: Nicotine dependence, unspecified, uncomplicated: F17.200

## 2021-11-13 HISTORY — DX: Depression, unspecified: F32.A

## 2021-11-13 HISTORY — DX: Headache, unspecified: R51.9

## 2021-11-13 HISTORY — DX: Anxiety disorder, unspecified: F41.9

## 2021-11-13 LAB — CBC WITH DIFF
BASOPHIL #: <0.1 10*3/uL (ref <=0.20)
BASOPHIL %: 1 %
EOSINOPHIL #: 0.11 10*3/uL (ref <=0.50)
EOSINOPHIL %: 2 %
HCT: 41.3 % (ref 38.9–52.0)
HGB: 14.2 g/dL (ref 13.4–17.5)
IMMATURE GRANULOCYTE #: <0.1 10*3/uL (ref <0.10)
IMMATURE GRANULOCYTE %: 0 % (ref 0–1)
LYMPHOCYTE #: 2.13 10*3/uL (ref 1.00–4.80)
LYMPHOCYTE %: 31 %
MCH: 29.6 pg (ref 26.0–32.0)
MCHC: 34.4 g/dL (ref 31.0–35.5)
MCV: 86 fL (ref 78.0–100.0)
MONOCYTE #: 0.45 10*3/uL (ref 0.20–1.10)
MONOCYTE %: 7 %
MPV: 9.5 fL (ref 8.7–12.5)
NEUTROPHIL #: 4.15 10*3/uL (ref 1.50–7.70)
NEUTROPHIL %: 59 %
PLATELETS: 274 10*3/uL (ref 150–400)
RBC: 4.8 10*6/uL (ref 4.50–6.10)
RDW-CV: 12.4 % (ref 11.5–15.5)
WBC: 6.9 10*3/uL (ref 3.7–11.0)

## 2021-11-13 LAB — URINALYSIS, MACROSCOPIC
BILIRUBIN: NEGATIVE mg/dL
BLOOD: NEGATIVE mg/dL
COLOR: NORMAL
GLUCOSE: NEGATIVE mg/dL
KETONES: NEGATIVE mg/dL
LEUKOCYTES: NEGATIVE WBCs/uL
NITRITE: NEGATIVE
PH: 6 (ref 5.0–8.0)
PROTEIN: NEGATIVE mg/dL
SPECIFIC GRAVITY: 1.026 (ref 1.005–1.030)
UROBILINOGEN: NEGATIVE mg/dL

## 2021-11-13 LAB — ELECTROLYTES
ANION GAP: 9 mmol/L (ref 4–13)
CHLORIDE: 106 mmol/L (ref 96–111)
CO2 TOTAL: 25 mmol/L (ref 22–30)
POTASSIUM: 3.9 mmol/L (ref 3.5–5.1)
SODIUM: 140 mmol/L (ref 136–145)

## 2021-11-13 LAB — URINALYSIS, MICROSCOPIC
HYALINE CASTS: 1 /lpf (ref <4.0)
RBCS: 2 /hpf (ref <6.0)
WBCS: <1 /hpf (ref <4.0)

## 2021-11-13 LAB — PT/INR
INR: 0.9 (ref 0.80–1.20)
PROTHROMBIN TIME: 10.4 seconds (ref 9.1–13.9)

## 2021-11-13 LAB — TYPE AND SCREEN
ABO/RH(D): B POS
ANTIBODY SCREEN: NEGATIVE

## 2021-11-13 LAB — PTT (PARTIAL THROMBOPLASTIN TIME): APTT: 30 seconds (ref 24.2–37.5)

## 2021-11-13 LAB — BUN: BUN: 11 mg/dL (ref 8–25)

## 2021-11-13 LAB — CREATININE WITH EGFR
CREATININE: 0.87 mg/dL (ref 0.75–1.35)
ESTIMATED GFR: >90 mL/min/BSA (ref >=60)

## 2021-11-13 LAB — POC BLOOD GLUCOSE (RESULTS): GLUCOSE, POC: 91 mg/dL (ref 70–105)

## 2021-11-13 MED ORDER — GADOBUTROL 1 MMOL/ML (604.72 MG/ML) INTRAVENOUS SOLUTION
8.0000 mL | INTRAVENOUS | Status: AC
Start: 2021-11-13 — End: 2021-11-13
  Administered 2021-11-13: 8 mL via INTRAVENOUS

## 2021-11-13 NOTE — Anesthesia Preprocedure Evaluation (Addendum)
ANESTHESIA PRE-OP EVALUATION  [name] [name]  Planned Procedure: CRANIOTOMY FOR TUMOR WITH NAVIGATION (Right)  CT SCAN (Right)  Review of Systems     anesthesia history negative     patient summary reviewed          Pulmonary   current smoker,   Cardiovascular    ECG reviewed ,No peripheral edema,  Exercise Tolerance: > or = 4 METS        GI/Hepatic/Renal    liver disease and + hepatitis C (treated)        Endo/Other   neg endo/other ROS,       Neuro/Psych/MS    seizures, well controlled, headaches, anxiety, depression, Substance use     Cancer    negative hematology/oncology ROS,                 Physical Assessment      Airway       Mallampati: II    TM distance: 3 FB    Neck ROM: full  Mouth Opening: good.  Facial hair  Beard  No endotracheal tube present  No Tracheostomy present    Dental           (+) chipped, missing           Pulmonary    Breath sounds clear to auscultation  (-) no rhonchi, no decreased breath sounds, no wheezes, no rales and no stridor     Cardiovascular    Rhythm: regular  Rate: Normal  (-) no peripheral edema and no murmur     Other findings            Plan  ASA 3     Planned anesthesia type: general     general anesthesia with endotracheal tube intubation            Additional Plans: Arterial line    PONV/POV Plan:  I plan to administer pharmcologic prophalaxis antiemetics  Intravenous induction     Anesthesia issues/risks discussed are: Dental Injuries, Nerve Injuries, PONV, Eye /Visual Loss, Art Line Placement, Stroke, Cardiac Events/MI, Intraoperative Awareness/ Recall, Blood Loss, Aspiration, Difficult Airway and Sore Throat.  Anesthetic plan and risks discussed with patient  signed consent obtained        Use of blood products discussed with patient who consented to blood products.      Patient's NPO status is appropriate for Anesthesia.           Plan discussed with attending.                 EKG Ordered: 11/13/2021 ordered by service     CXR Ordered:  11/13/2021 ordered by  service    Other Studies: none    Labs Ordered: 11/13/21 ordered by service    Consults: None    Patient instructed to take the following medications day of surgery, Abilify, tegretol, vimpat, clonidine    Nothing to hold     Hold vitamins, supplements, herbals, fish oil, and NSAID'S 1 week prior to surgery.    Patient provided anesthesia consent in PEC. Instructed to review consent prior to OR date. Educated that consent will be signed morning of surgery with anesthesiologist.
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
## 2021-11-13 NOTE — Progress Notes (Signed)
Brief Note:    [name] [name] is a 31 year old male that presents as a referral for right temporal brain lesion.  He has a history of medically refractory epilepsy but has been doing well after recent medication adjustment by our neurology colleagues.    He has been seen previously with these lesion but was lost to follow-up and subsequently referred for management.  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  Given the patient's age and lack of tissue diagnosis, we discussed resection versus observation and continued follow-up.    The patient and his wife voice understanding and are in favor of undergoing resection.    Written, informed consent obtained.  We discussed specifically risks of infection, stroke - particularly given close proximity to Sylvian fissure and middle cerebral artery branches, and potential for non-diagnostic tissue as well as possible missing of lesion given relatively small size and potential for navigation to not be 100% accurate.    Cornella Emmer C. Radford Pax, MD, PhD  Department of Neurosurgery

## 2021-11-14 DIAGNOSIS — D332 Benign neoplasm of brain, unspecified: Secondary | ICD-10-CM

## 2021-11-14 DIAGNOSIS — R569 Unspecified convulsions: Secondary | ICD-10-CM

## 2021-11-14 LAB — ECG 12-LEAD (PERFORMED IN PREADMISSION UNIT ONLY)
Atrial Rate: 56 {beats}/min
Calculated P Axis: 47 degrees
Calculated R Axis: 78 degrees
Calculated T Axis: 60 degrees
PR Interval: 160 ms
QRS Duration: 102 ms
QT Interval: 378 ms
QTC Calculation: 364 ms
Ventricular rate: 56 {beats}/min

## 2021-12-04 ENCOUNTER — Telehealth (INDEPENDENT_AMBULATORY_CARE_PROVIDER_SITE_OTHER): Payer: Self-pay | Admitting: Family Medicine

## 2021-12-04 NOTE — Telephone Encounter (Signed)
Patient's mother left vmail asking if you would refill his Abilify 10 mg. She said they just let him out from under [facility] and he would like you to fill it until he finds another provider.  (Patient's last visit here was 05/20/21 for CDM. Has appt booked for 12/12/21 but according to his chart he is doing pre-op @ [facility] for upcoming [place])

## 2021-12-09 ENCOUNTER — Other Ambulatory Visit (INDEPENDENT_AMBULATORY_CARE_PROVIDER_SITE_OTHER): Payer: Self-pay | Admitting: Family Medicine

## 2021-12-09 MED ORDER — ARIPIPRAZOLE 10 MG TABLET
10.0000 mg | ORAL_TABLET | Freq: Every day | ORAL | 0 refills | Status: AC
Start: 2021-12-09 — End: 2022-03-09

## 2021-12-10 ENCOUNTER — Encounter (INDEPENDENT_AMBULATORY_CARE_PROVIDER_SITE_OTHER): Payer: Self-pay | Admitting: Student in an Organized Health Care Education/Training Program

## 2021-12-10 MED ORDER — CARBAMAZEPINE ER 200 MG TABLET,EXTENDED RELEASE,12 HR
400.0000 mg | ORAL_TABLET | Freq: Two times a day (BID) | ORAL | 1 refills | Status: DC
Start: 2021-12-10 — End: 2022-07-13

## 2021-12-12 ENCOUNTER — Inpatient Hospital Stay
Admission: RE | Admit: 2021-12-12 | Discharge: 2021-12-13 | DRG: 027 | Disposition: A | Discharge status: Home / Self Care | Payer: 59 | Source: Ambulatory Visit | Attending: Student in an Organized Health Care Education/Training Program | Admitting: Student in an Organized Health Care Education/Training Program

## 2021-12-12 ENCOUNTER — Inpatient Hospital Stay (HOSPITAL_BASED_OUTPATIENT_CLINIC_OR_DEPARTMENT_OTHER): Payer: 59 | Admitting: Student in an Organized Health Care Education/Training Program

## 2021-12-12 ENCOUNTER — Inpatient Hospital Stay (HOSPITAL_COMMUNITY): Payer: 59 | Admitting: NURSE PRACTITIONER

## 2021-12-12 ENCOUNTER — Inpatient Hospital Stay (HOSPITAL_COMMUNITY): Payer: 59 | Admitting: Radiology

## 2021-12-12 ENCOUNTER — Other Ambulatory Visit (HOSPITAL_COMMUNITY): Payer: 59 | Admitting: Radiology

## 2021-12-12 ENCOUNTER — Other Ambulatory Visit: Payer: Self-pay

## 2021-12-12 ENCOUNTER — Encounter (HOSPITAL_COMMUNITY)
Admission: RE | Disposition: A | Discharge status: Home / Self Care | Payer: Self-pay | Source: Ambulatory Visit | Attending: Student in an Organized Health Care Education/Training Program

## 2021-12-12 DIAGNOSIS — D332 Benign neoplasm of brain, unspecified: Secondary | ICD-10-CM

## 2021-12-12 DIAGNOSIS — Z01818 Encounter for other preprocedural examination: Principal | ICD-10-CM

## 2021-12-12 DIAGNOSIS — F419 Anxiety disorder, unspecified: Secondary | ICD-10-CM | POA: Diagnosis present

## 2021-12-12 DIAGNOSIS — Z9889 Other specified postprocedural states: Secondary | ICD-10-CM

## 2021-12-12 DIAGNOSIS — R569 Unspecified convulsions: Secondary | ICD-10-CM

## 2021-12-12 DIAGNOSIS — G9389 Other specified disorders of brain: Secondary | ICD-10-CM | POA: Diagnosis present

## 2021-12-12 DIAGNOSIS — D330 Benign neoplasm of brain, supratentorial: Principal | ICD-10-CM | POA: Diagnosis present

## 2021-12-12 DIAGNOSIS — C712 Malignant neoplasm of temporal lobe: Secondary | ICD-10-CM

## 2021-12-12 DIAGNOSIS — F32A Depression, unspecified: Secondary | ICD-10-CM | POA: Diagnosis present

## 2021-12-12 DIAGNOSIS — G40909 Epilepsy, unspecified, not intractable, without status epilepticus: Secondary | ICD-10-CM | POA: Diagnosis present

## 2021-12-12 DIAGNOSIS — F909 Attention-deficit hyperactivity disorder, unspecified type: Secondary | ICD-10-CM | POA: Diagnosis present

## 2021-12-12 DIAGNOSIS — D430 Neoplasm of uncertain behavior of brain, supratentorial: Secondary | ICD-10-CM

## 2021-12-12 DIAGNOSIS — Z87891 Personal history of nicotine dependence: Secondary | ICD-10-CM

## 2021-12-12 LAB — TYPE AND SCREEN
ABO/RH(D): B POS
ANTIBODY SCREEN: NEGATIVE

## 2021-12-12 SURGERY — CRANIOTOMY FOR TUMOR WITH NAVIGATION
Anesthesia: General | Laterality: Right | Wound class: Clean Wound: Uninfected operative wounds in which no inflammation occurred

## 2021-12-12 MED ORDER — SODIUM CHLORIDE 0.9% FLUSH BAG - 250 ML
INTRAVENOUS | Status: DC | PRN
Start: 2021-12-12 — End: 2021-12-12

## 2021-12-12 MED ORDER — PROPOFOL 10 MG/ML INTRAVENOUS EMULSION
INTRAVENOUS | Status: AC
Start: 2021-12-12 — End: 2021-12-12
  Filled 2021-12-12: qty 20

## 2021-12-12 MED ORDER — BACITRACIN 500 UNIT/G OINTMENT TUBE
TOPICAL_OINTMENT | CUTANEOUS | Status: AC
Start: 2021-12-12 — End: 2021-12-12
  Filled 2021-12-12: qty 28

## 2021-12-12 MED ORDER — LIDOCAINE 1 %-EPINEPHRINE 1:100,000 INJECTION SOLUTION
15.0000 mL | Freq: Once | INTRAMUSCULAR | Status: DC | PRN
Start: 2021-12-12 — End: 2021-12-12
  Administered 2021-12-12: 10 mL via INTRAMUSCULAR

## 2021-12-12 MED ORDER — SODIUM CHLORIDE 0.9 % (FLUSH) INJECTION SYRINGE
2.0000 mL | INJECTION | INTRAMUSCULAR | Status: DC | PRN
Start: 2021-12-12 — End: 2021-12-12

## 2021-12-12 MED ORDER — REMIFENTANIL 1 MG INTRAVENOUS SOLUTION
INTRAVENOUS | Status: AC
Start: 2021-12-12 — End: 2021-12-12
  Filled 2021-12-12: qty 1

## 2021-12-12 MED ORDER — PROPOFOL 10 MG/ML IV BOLUS
INJECTION | Freq: Once | INTRAVENOUS | Status: DC | PRN
Start: 2021-12-12 — End: 2021-12-12
  Administered 2021-12-12: 200 mg via INTRAVENOUS

## 2021-12-12 MED ORDER — GLYCOPYRROLATE 0.2 MG/ML INJECTION SOLUTION
INTRAMUSCULAR | Status: AC
Start: 2021-12-12 — End: 2021-12-12
  Filled 2021-12-12: qty 1

## 2021-12-12 MED ORDER — GLYCOPYRROLATE 0.2 MG/ML INJECTION SOLUTION
Freq: Once | INTRAMUSCULAR | Status: DC | PRN
Start: 2021-12-12 — End: 2021-12-12
  Administered 2021-12-12: .2 mg via INTRAVENOUS

## 2021-12-12 MED ORDER — FENTANYL (PF) 50 MCG/ML INJECTION SOLUTION
12.5000 ug | INTRAMUSCULAR | Status: DC | PRN
Start: 2021-12-12 — End: 2021-12-12

## 2021-12-12 MED ORDER — ONDANSETRON HCL (PF) 4 MG/2 ML INJECTION SOLUTION
Freq: Once | INTRAMUSCULAR | Status: DC | PRN
Start: 2021-12-12 — End: 2021-12-12
  Administered 2021-12-12: 4 mg via INTRAVENOUS

## 2021-12-12 MED ORDER — DEXAMETHASONE SODIUM PHOSPHATE 4 MG/ML INJECTION SOLUTION
Freq: Once | INTRAMUSCULAR | Status: DC | PRN
Start: 2021-12-12 — End: 2021-12-12
  Administered 2021-12-12: 4 mg via INTRAVENOUS

## 2021-12-12 MED ORDER — LACTATED RINGERS INTRAVENOUS SOLUTION
INTRAVENOUS | Status: DC
Start: 2021-12-12 — End: 2021-12-12

## 2021-12-12 MED ORDER — DIPHENHYDRAMINE 50 MG/ML INJECTION SOLUTION
25.0000 mg | Freq: Three times a day (TID) | INTRAMUSCULAR | Status: DC | PRN
Start: 2021-12-12 — End: 2021-12-13
  Administered 2021-12-12 – 2021-12-13 (×2): 25 mg via INTRAVENOUS
  Filled 2021-12-12 (×2): qty 1

## 2021-12-12 MED ORDER — VANCOMYCIN 1,000 MG IV POWDER - FOR OR
1.0000 g | Freq: Once | TOPICAL | Status: DC | PRN
Start: 2021-12-12 — End: 2021-12-12

## 2021-12-12 MED ORDER — FENTANYL (PF) 50 MCG/ML INJECTION SOLUTION
INTRAMUSCULAR | Status: AC
Start: 2021-12-12 — End: 2021-12-12
  Filled 2021-12-12: qty 5

## 2021-12-12 MED ORDER — SODIUM CHLORIDE 0.9 % (FLUSH) INJECTION SYRINGE
2.0000 mL | INJECTION | Freq: Three times a day (TID) | INTRAMUSCULAR | Status: DC
Start: 2021-12-12 — End: 2021-12-12
  Administered 2021-12-12: 0 mL
  Administered 2021-12-12: 2 mL

## 2021-12-12 MED ORDER — VANCOMYCIN 1,000 MG INTRAVENOUS INJECTION
Freq: Once | INTRAVENOUS | Status: DC | PRN
Start: 2021-12-12 — End: 2021-12-12
  Filled 2021-12-12: qty 10

## 2021-12-12 MED ORDER — OXYCODONE 5 MG TABLET
5.0000 mg | ORAL_TABLET | ORAL | Status: DC | PRN
Start: 2021-12-12 — End: 2021-12-13
  Administered 2021-12-12 (×2): 5 mg via ORAL
  Filled 2021-12-12 (×2): qty 1

## 2021-12-12 MED ORDER — FENTANYL (PF) 50 MCG/ML INJECTION SOLUTION
Freq: Once | INTRAMUSCULAR | Status: DC | PRN
Start: 2021-12-12 — End: 2021-12-12
  Administered 2021-12-12 (×2): 125 ug via INTRAVENOUS

## 2021-12-12 MED ORDER — LACTATED RINGERS INTRAVENOUS SOLUTION
INTRAVENOUS | Status: DC | PRN
Start: 2021-12-12 — End: 2021-12-12
  Administered 2021-12-12: 0 via INTRAVENOUS

## 2021-12-12 MED ORDER — VANCOMYCIN 1,000 MG INTRAVENOUS INJECTION
INTRAVENOUS | Status: AC
Start: 2021-12-12 — End: 2021-12-12
  Filled 2021-12-12: qty 30

## 2021-12-12 MED ORDER — CARBAMAZEPINE ER 200 MG CAPSULE,EXTENDED RELEASE MPHASE12HR
200.0000 mg | ORAL_CAPSULE | Freq: Two times a day (BID) | ORAL | Status: DC
Start: 2021-12-12 — End: 2021-12-12
  Filled 2021-12-12 (×2): qty 1

## 2021-12-12 MED ORDER — DEXAMETHASONE SODIUM PHOSPHATE 4 MG/ML INJECTION SOLUTION
INTRAMUSCULAR | Status: AC
Start: 2021-12-12 — End: 2021-12-12
  Filled 2021-12-12: qty 1

## 2021-12-12 MED ORDER — ONDANSETRON HCL (PF) 4 MG/2 ML INJECTION SOLUTION
INTRAMUSCULAR | Status: AC
Start: 2021-12-12 — End: 2021-12-12
  Filled 2021-12-12: qty 2

## 2021-12-12 MED ORDER — DIPHENHYDRAMINE 50 MG/ML INJECTION SOLUTION
25.0000 mg | Freq: Three times a day (TID) | INTRAMUSCULAR | Status: DC | PRN
Start: 2021-12-12 — End: 2021-12-12

## 2021-12-12 MED ORDER — ACETAMINOPHEN 325 MG TABLET
650.0000 mg | ORAL_TABLET | ORAL | Status: DC | PRN
Start: 2021-12-12 — End: 2021-12-13
  Administered 2021-12-12 – 2021-12-13 (×3): 650 mg via ORAL
  Filled 2021-12-12 (×4): qty 2

## 2021-12-12 MED ORDER — HYDROMORPHONE 1 MG/ML INJECTION WRAPPER
0.2000 mg | INJECTION | INTRAMUSCULAR | Status: DC | PRN
Start: 2021-12-12 — End: 2021-12-12

## 2021-12-12 MED ORDER — OXACILLIN 10 GRAM SOLUTION FOR INJECTION
2.0000 g | Freq: Once | INTRAMUSCULAR | Status: AC
Start: 2021-12-12 — End: 2021-12-12
  Administered 2021-12-12: 2 g via INTRAVENOUS
  Filled 2021-12-12: qty 20

## 2021-12-12 MED ORDER — ACETAMINOPHEN 1,000 MG/100 ML (10 MG/ML) INTRAVENOUS SOLUTION
Freq: Once | INTRAVENOUS | Status: DC | PRN
Start: 2021-12-12 — End: 2021-12-12
  Administered 2021-12-12: 1000 mg via INTRAVENOUS

## 2021-12-12 MED ORDER — CARBAMAZEPINE ER 200 MG CAPSULE,EXTENDED RELEASE MPHASE12HR
400.0000 mg | ORAL_CAPSULE | Freq: Two times a day (BID) | ORAL | Status: DC
Start: 2021-12-12 — End: 2021-12-12
  Filled 2021-12-12: qty 2

## 2021-12-12 MED ORDER — PROCHLORPERAZINE EDISYLATE 10 MG/2 ML (5 MG/ML) INJECTION SOLUTION
5.0000 mg | Freq: Once | INTRAMUSCULAR | Status: DC | PRN
Start: 2021-12-12 — End: 2021-12-12
  Administered 2021-12-12: 5 mg via INTRAVENOUS

## 2021-12-12 MED ORDER — SODIUM CHLORIDE 0.9 % (FLUSH) INJECTION SYRINGE
2.0000 mL | INJECTION | Freq: Three times a day (TID) | INTRAMUSCULAR | Status: DC
Start: 2021-12-12 — End: 2021-12-12
  Administered 2021-12-12: 2 mL

## 2021-12-12 MED ORDER — GELATIN MATRIX SEALANT (FLOSEAL) 10 ML KIT
PACK | Freq: Once | CUTANEOUS | Status: DC | PRN
Start: 2021-12-12 — End: 2021-12-12

## 2021-12-12 MED ORDER — CARBAMAZEPINE ER 200 MG CAPSULE,EXTENDED RELEASE MPHASE12HR
400.0000 mg | ORAL_CAPSULE | Freq: Two times a day (BID) | ORAL | Status: DC
Start: 2021-12-12 — End: 2021-12-13
  Administered 2021-12-12 – 2021-12-13 (×3): 400 mg via ORAL
  Filled 2021-12-12 (×4): qty 2

## 2021-12-12 MED ORDER — ONDANSETRON HCL (PF) 4 MG/2 ML INJECTION SOLUTION
4.0000 mg | Freq: Once | INTRAMUSCULAR | Status: DC | PRN
Start: 2021-12-12 — End: 2021-12-12

## 2021-12-12 MED ORDER — PROPOFOL 10 MG/ML INTRAVENOUS EMULSION
INTRAVENOUS | Status: DC | PRN
Start: 2021-12-12 — End: 2021-12-12
  Administered 2021-12-12: 150 ug/kg/min via INTRAVENOUS
  Administered 2021-12-12: 0 ug/kg/min via INTRAVENOUS
  Administered 2021-12-12 (×2): 100 ug/kg/min via INTRAVENOUS

## 2021-12-12 MED ORDER — LIDOCAINE (PF) 100 MG/5 ML (2 %) INTRAVENOUS SYRINGE
INJECTION | Freq: Once | INTRAVENOUS | Status: DC | PRN
Start: 2021-12-12 — End: 2021-12-12
  Administered 2021-12-12: 100 mg via INTRAVENOUS

## 2021-12-12 MED ORDER — PHENYLEPHRINE 50 MG/250 ML (200 MCG/ML) IN 0.9 % SODIUM CHLORIDE IV
INTRAVENOUS | Status: DC | PRN
Start: 2021-12-12 — End: 2021-12-12
  Administered 2021-12-12: .2 ug/kg/min via INTRAVENOUS
  Administered 2021-12-12: 0 ug/kg/min via INTRAVENOUS

## 2021-12-12 MED ORDER — SURGIFOAM SIZE 100 CM SPONGE
VAGINAL_SPONGE | CUTANEOUS | Status: AC
Start: 2021-12-12 — End: 2021-12-12
  Filled 2021-12-12: qty 3

## 2021-12-12 MED ORDER — LACOSAMIDE 100 MG TABLET
100.0000 mg | ORAL_TABLET | Freq: Two times a day (BID) | ORAL | Status: DC
Start: 2021-12-12 — End: 2021-12-13
  Administered 2021-12-12: 0 mg via ORAL
  Administered 2021-12-12: 100 mg via ORAL
  Administered 2021-12-13: 0 mg via ORAL
  Filled 2021-12-12: qty 1

## 2021-12-12 MED ORDER — NITROGLYCERIN 50 MCG/ML IV DILUTION - FOR ANES
INJECTION | Freq: Once | INTRAVENOUS | Status: DC | PRN
Start: 2021-12-12 — End: 2021-12-12
  Administered 2021-12-12: 50 ug via INTRAVENOUS

## 2021-12-12 MED ORDER — DEXAMETHASONE SODIUM PHOSPHATE 4 MG/ML INJECTION SOLUTION
4.0000 mg | Freq: Once | INTRAMUSCULAR | Status: DC | PRN
Start: 2021-12-12 — End: 2021-12-12

## 2021-12-12 MED ORDER — SODIUM CHLORIDE 0.9 % INJECTION SOLUTION
Freq: Once | INTRAMUSCULAR | Status: DC | PRN
Start: 2021-12-12 — End: 2021-12-12

## 2021-12-12 MED ORDER — SURGIFOAM SIZE 100 CM SPONGE
1.0000 | VAGINAL_SPONGE | Freq: Once | CUTANEOUS | Status: DC | PRN
Start: 2021-12-12 — End: 2021-12-12
  Administered 2021-12-12: 1 via TOPICAL

## 2021-12-12 MED ORDER — MIDAZOLAM (PF) 1 MG/ML INJECTION SOLUTION
Freq: Once | INTRAMUSCULAR | Status: DC | PRN
Start: 2021-12-12 — End: 2021-12-12
  Administered 2021-12-12: 2 mg via INTRAVENOUS

## 2021-12-12 MED ORDER — CLONIDINE HCL 0.1 MG TABLET
0.1000 mg | ORAL_TABLET | Freq: Two times a day (BID) | ORAL | Status: DC | PRN
Start: 2021-12-12 — End: 2021-12-13

## 2021-12-12 MED ORDER — ONDANSETRON HCL (PF) 4 MG/2 ML INJECTION SOLUTION
4.0000 mg | Freq: Four times a day (QID) | INTRAMUSCULAR | Status: DC | PRN
Start: 2021-12-12 — End: 2021-12-13

## 2021-12-12 MED ORDER — SUCCINYLCHOLINE 20 MG/ML INTRAVENOUS WRAPPER
INJECTION | Freq: Once | INTRAVENOUS | Status: DC | PRN
Start: 2021-12-12 — End: 2021-12-12
  Administered 2021-12-12: 180 mg via INTRAVENOUS

## 2021-12-12 MED ORDER — SODIUM CHLORIDE 0.9 % (FLUSH) INJECTION SYRINGE
2.0000 mL | INJECTION | INTRAMUSCULAR | Status: DC | PRN
Start: 2021-12-12 — End: 2021-12-13

## 2021-12-12 MED ORDER — DOCUSATE SODIUM 100 MG CAPSULE
100.0000 mg | ORAL_CAPSULE | Freq: Two times a day (BID) | ORAL | Status: DC
Start: 2021-12-12 — End: 2021-12-13
  Administered 2021-12-12: 100 mg via ORAL
  Administered 2021-12-13: 0 mg via ORAL
  Filled 2021-12-12: qty 1

## 2021-12-12 MED ORDER — LIDOCAINE 1 %-EPINEPHRINE 1:100,000 INJECTION SOLUTION
INTRAMUSCULAR | Status: AC
Start: 2021-12-12 — End: 2021-12-12
  Filled 2021-12-12: qty 60

## 2021-12-12 MED ORDER — GELATIN MATRIX SEALANT (FLOSEAL) 10 ML KIT
PACK | CUTANEOUS | Status: AC
Start: 2021-12-12 — End: 2021-12-12
  Filled 2021-12-12: qty 3

## 2021-12-12 MED ORDER — SODIUM CHLORIDE 0.9% FLUSH BAG - 250 ML
INTRAVENOUS | Status: DC | PRN
Start: 2021-12-12 — End: 2021-12-13

## 2021-12-12 MED ORDER — ARIPIPRAZOLE 10 MG TABLET
10.0000 mg | ORAL_TABLET | Freq: Every day | ORAL | Status: DC
Start: 2021-12-12 — End: 2021-12-13
  Administered 2021-12-12 – 2021-12-13 (×2): 0 mg via ORAL
  Filled 2021-12-12 (×2): qty 1

## 2021-12-12 MED ORDER — LIDOCAINE (PF) 20 MG/ML (2 %) INJECTION SOLUTION
INTRAMUSCULAR | Status: AC
Start: 2021-12-12 — End: 2021-12-12
  Filled 2021-12-12: qty 5

## 2021-12-12 MED ORDER — DEXTROSE 5% IN WATER (D5W) FLUSH BAG - 250 ML
INTRAVENOUS | Status: DC | PRN
Start: 2021-12-12 — End: 2021-12-13

## 2021-12-12 MED ORDER — SODIUM CHLORIDE 0.9 % INTRAVENOUS SOLUTION
INTRAVENOUS | Status: DC | PRN
Start: 2021-12-12 — End: 2021-12-12
  Administered 2021-12-12: 0 via INTRAVENOUS

## 2021-12-12 MED ORDER — DEXTROSE 5% IN WATER (D5W) FLUSH BAG - 250 ML
INTRAVENOUS | Status: DC | PRN
Start: 2021-12-12 — End: 2021-12-12

## 2021-12-12 MED ORDER — REMIFENTANIL 50 MCG/ML INFUSION - FOR ANES
INTRAVENOUS | Status: DC | PRN
Start: 2021-12-12 — End: 2021-12-12
  Administered 2021-12-12 (×2): .1 ug/kg/min via INTRAVENOUS
  Administered 2021-12-12: 0 ug/kg/min via INTRAVENOUS
  Administered 2021-12-12: .075 ug/kg/min via INTRAVENOUS
  Administered 2021-12-12: .15 ug/kg/min via INTRAVENOUS

## 2021-12-12 MED ORDER — EPHEDRINE SULFATE 50 MG/ML INTRAVENOUS SOLUTION
Freq: Once | INTRAVENOUS | Status: DC | PRN
Start: 2021-12-12 — End: 2021-12-12
  Administered 2021-12-12 (×2): 5 mg via INTRAVENOUS

## 2021-12-12 MED ORDER — THROMBIN (RECOMBINANT) 5,000 UNIT TOPICAL SOLUTION
CUTANEOUS | Status: AC
Start: 2021-12-12 — End: 2021-12-12
  Filled 2021-12-12: qty 3

## 2021-12-12 MED ORDER — EPHEDRINE SULFATE 5 MG/ML INTRAVENOUS SOLUTION
INTRAVENOUS | Status: AC
Start: 2021-12-12 — End: 2021-12-12
  Filled 2021-12-12: qty 10

## 2021-12-12 MED ORDER — FENTANYL (PF) 50 MCG/ML INJECTION SOLUTION
25.0000 ug | INTRAMUSCULAR | Status: DC | PRN
Start: 2021-12-12 — End: 2021-12-12
  Administered 2021-12-12 (×2): 25 ug via INTRAVENOUS
  Filled 2021-12-12: qty 2

## 2021-12-12 MED ORDER — MAGNESIUM SULFATE 1 GRAM/100 ML IN DEXTROSE 5 % INTRAVENOUS PIGGYBACK
1.0000 g | INJECTION | Freq: Three times a day (TID) | INTRAVENOUS | Status: DC | PRN
Start: 2021-12-12 — End: 2021-12-13
  Administered 2021-12-12: 1 g via INTRAVENOUS
  Administered 2021-12-12: 0 g via INTRAVENOUS
  Administered 2021-12-13: 1 g via INTRAVENOUS
  Administered 2021-12-13: 0 g via INTRAVENOUS
  Filled 2021-12-12 (×2): qty 100

## 2021-12-12 MED ORDER — PHENYLEPHRINE 1 MG/10 ML (100 MCG/ML) IN 0.9 % SOD.CHLORIDE IV SYRINGE
INJECTION | Freq: Once | INTRAVENOUS | Status: DC | PRN
Start: 2021-12-12 — End: 2021-12-12
  Administered 2021-12-12: 50 ug via INTRAVENOUS

## 2021-12-12 MED ORDER — MIDAZOLAM 1 MG/ML INJECTION SOLUTION
INTRAMUSCULAR | Status: AC
Start: 2021-12-12 — End: 2021-12-12
  Filled 2021-12-12: qty 2

## 2021-12-12 MED ORDER — HYDROMORPHONE 1 MG/ML INJECTION WRAPPER
0.4000 mg | INJECTION | INTRAMUSCULAR | Status: AC | PRN
Start: 2021-12-12 — End: 2021-12-12
  Administered 2021-12-12 (×5): 0.4 mg via INTRAVENOUS
  Filled 2021-12-12 (×2): qty 1

## 2021-12-12 MED ORDER — SENNOSIDES 8.6 MG-DOCUSATE SODIUM 50 MG TABLET
1.0000 | ORAL_TABLET | Freq: Every day | ORAL | Status: DC
Start: 2021-12-12 — End: 2021-12-13
  Administered 2021-12-12: 1 via ORAL
  Filled 2021-12-12: qty 1

## 2021-12-12 MED ORDER — BACITRACIN 500 UNIT/G OINTMENT TUBE
TOPICAL_OINTMENT | Freq: Once | CUTANEOUS | Status: DC | PRN
Start: 2021-12-12 — End: 2021-12-12

## 2021-12-12 MED ORDER — OXACILLIN 10 GRAM SOLUTION FOR INJECTION
1.0000 g | INTRAMUSCULAR | Status: DC
Start: 2021-12-12 — End: 2021-12-12

## 2021-12-12 MED ORDER — SODIUM CHLORIDE 0.9 % IRRIGATION SOLUTION
1000.0000 mL | Status: DC | PRN
Start: 2021-12-12 — End: 2021-12-12
  Administered 2021-12-12: 1000 mL

## 2021-12-12 MED ORDER — PROCHLORPERAZINE EDISYLATE 10 MG/2 ML (5 MG/ML) INJECTION SOLUTION
10.0000 mg | Freq: Three times a day (TID) | INTRAMUSCULAR | Status: DC | PRN
Start: 2021-12-12 — End: 2021-12-13
  Administered 2021-12-12 – 2021-12-13 (×2): 10 mg via INTRAVENOUS
  Filled 2021-12-12 (×2): qty 2

## 2021-12-12 MED ORDER — SODIUM CHLORIDE 0.9 % (FLUSH) INJECTION SYRINGE
2.0000 mL | INJECTION | Freq: Three times a day (TID) | INTRAMUSCULAR | Status: DC
Start: 2021-12-12 — End: 2021-12-13
  Administered 2021-12-12: 2 mL
  Administered 2021-12-12 – 2021-12-13 (×2): 3 mL

## 2021-12-12 MED ORDER — SODIUM CHLORIDE 0.9 % INTRAVENOUS SOLUTION
2.0000 g | INTRAVENOUS | Status: AC
Start: 2021-12-12 — End: 2021-12-13
  Administered 2021-12-12 (×4): 2 g via INTRAVENOUS
  Administered 2021-12-12 (×3): 0 g via INTRAVENOUS
  Administered 2021-12-13: 2 g via INTRAVENOUS
  Administered 2021-12-13 (×2): 0 g via INTRAVENOUS
  Administered 2021-12-13: 2 g via INTRAVENOUS
  Administered 2021-12-13: 0 g via INTRAVENOUS
  Filled 2021-12-12 (×7): qty 20

## 2021-12-12 MED ORDER — HYDROMORPHONE 1 MG/ML INJECTION WRAPPER
0.2000 mg | INJECTION | Freq: Four times a day (QID) | INTRAMUSCULAR | Status: DC | PRN
Start: 2021-12-12 — End: 2021-12-13
  Administered 2021-12-12 – 2021-12-13 (×4): 0.2 mg via INTRAVENOUS
  Filled 2021-12-12 (×4): qty 1

## 2021-12-12 MED ORDER — OXYCODONE 5 MG TABLET
5.0000 mg | ORAL_TABLET | ORAL | Status: DC | PRN
Start: 2021-12-12 — End: 2021-12-13
  Administered 2021-12-12: 5 mg via ORAL
  Filled 2021-12-12: qty 1

## 2021-12-12 SURGICAL SUPPLY — 86 items
BALL COTTON 1IN RADOPQ STRNG STRL (MED SURG SUPPLIES) ×1 IMPLANT
BALL COTTON XRY DTBL STNG HI A_BS EZ ADP SFT 1IN (MED SURG SUPPLIES) ×1
BIT DRILL 1.7MM ELT WRPS STRL LF  DISP (SURGICAL CUTTING SUPPLIES) ×1 IMPLANT
BIT DRILL 1.7MM WRPS STRL LF_DISP (CUTTING ELEMENTS) ×1
BIT DRILL 3.8MM 3MM PREC NEURO STRL LF  DISP (SURGICAL CUTTING SUPPLIES) ×1 IMPLANT
BIT DRILL 3.8MM 3MM PREC NEURO_STRL LF DISP (CUTTING ELEMENTS) ×1
BLADE SAW 12IN SS GIGLI NONST LF (SURGICAL CUTTING SUPPLIES) IMPLANT
BLADE SAW 12IN SS GIGLI NONST_LF (CUTTING ELEMENTS)
BLADE SAW 27MM RECIPROCATE THK.38MM TAPER THN MICRO PREC STRL LF  DISP (SURGICAL CUTTING SUPPLIES) ×1 IMPLANT
BLADE SAW 27X.38MM RECIP TPR S_HORT PREC STRL (CUTTING ELEMENTS) ×1
BLANKET ADULT 85.8X50IN FRC AIR WARM LF  WHT (MED SURG SUPPLIES) ×1 IMPLANT
BLANKET WARMER 85.8INW X 50INL_ADLT FULL BODY MISTRAL-AIR (MED SURG SUPPLIES) ×1
BURR SURG 54MM 1MM FN DIAMOND RND HEAD STRL (SURGICAL CUTTING SUPPLIES) ×1 IMPLANT
BURR SURG 54MM 1MM RND DIAMOND_FN (CUTTING ELEMENTS) ×1
CABLE BIPOLAR 12FT BIPOLAR BV CNMD VLAB DISP (ENDOSCOPIC SUPPLIES) IMPLANT
CAN SUCT 2000ML C2L IMPLOSION PRF SLF CNTN BUIL IN CAP HIFLO PATHOGEN FILTER DISP LTX TEAL (MED SURG SUPPLIES) IMPLANT
CAN SUCT 2000ML C2L IMPLOSION_PRF SLF CNTN BUIL IN CAP HIFLO (MED SURG SUPPLIES)
CASSETTE IRRG SONOPET IQ SUCT STRL LF  DISP (MED SURG SUPPLIES) IMPLANT
CASSETTE IRRG SONOPET IQ SUCT STRL LTX DISP (MED SURG SUPPLIES)
CATH EXTERN DRAIN EDM 80CM CLO_SE TIP IMPREGNATE LUMB BA SIL (MED SURG SUPPLIES)
CATH EXTERN DRAIN EDM 80CM CLS TIP BA IMPREGNATE LUMB SIL STRL (MED SURG SUPPLIES) IMPLANT
CLIP EXTERN SCLP 10 CLIP 20 CART STRL DISP (WOUND CARE SUPPLY) IMPLANT
CLIP EXTERN SCLP 10 CLIP 20_CART STRL DISP (WOUND CARE/ENTEROSTOMAL SUPPLY)
COMB HAIR PLASTIC .5 FN CRSE TOOTH BLK 7IN LF (MED SURG SUPPLIES) IMPLANT
COMB HAIR PLASTIC .5 FN CRSE_TOOTH BLK 7IN LF (MED SURG SUPPLIES)
CONV USE ITEM 338689 - PACK SURG CRANI STRL DISP LF (CUSTOM TRAYS & PACK) ×1 IMPLANT
CORD BIPO DUAL 12FT US349SP_BX/10 (INSTRUMENTS ENDOMECHANICAL)
COVER CAM KEEP CLR REUSE STRL_LF (DRAPE/PACKS/SHEETS/OR TOWEL) ×1
COVER CAM LEN DISP CLR STRL LF (DRAPE/PACKS/SHEETS/OR TOWEL) ×1 IMPLANT
DISCONTINUED NO SUB - BLADE SAW 12IN SS GIGLI NONST LF (SURGICAL CUTTING SUPPLIES) IMPLANT
DRAIN ACCUDRAIN ANRFLX V SYSTE_M CSF STRL LF DISP (MED SURG SUPPLIES)
DRAPE MICSCP 118X48IN OPM STRL_DISP EQP (DRAPE/PACKS/SHEETS/OR TOWEL) ×1
DRAPE MICSCP ECON LEN 118X48IN OPM VISIONGUARD STRL EQP 65MM (DRAPE/PACKS/SHEETS/OR TOWEL) ×1 IMPLANT
DRESS 6INX3 1/8IN PRMPR ACRL PLSTR HI ABS PAD NWVN SFT BRTHBL CVR WOUND STRL LF (WOUND CARE SUPPLY) ×1 IMPLANT
DRESSING PRIMAPORE 6 X 3.5IN_66000318 NON-AHD STRL 20EA/BX (WOUND CARE/ENTEROSTOMAL SUPPLY) ×1
ELECTRODE ESURG NEEDLE 4CM 3/32IN COLORADO STRL TUNG DISP INSL STR SLEEVE MCDSCT (SURGICAL CUTTING SUPPLIES) IMPLANT
ELECTRODE ESURG NEEDLE 4CM 3/3_2IN COLORADO STRL TUNG DISP (CUTTING ELEMENTS)
EXTERN DRAIN ACCUDRAIN CSF GRAD BRTT NDLS SAMPLE ST ANRFLX V SYSTEM STRL LF  DISP (MED SURG SUPPLIES) IMPLANT
FORCEPS BIPOLAR 8IN SPTZLR-MLS SLIM DISP (ENDOSCOPIC SUPPLIES) ×1 IMPLANT
FORCEPS BIPOLAR 8IN SPTZLR-MLS_SLIM 1MM DISP (INSTRUMENTS ENDOMECHANICAL) ×1
FORCEPS ESURG 8.5IN 2MM CDMN I_SCL AHT BIPOLAR LOW PROF 3.5IN (INSTRUMENTS ENDOMECHANICAL) ×1
FORCEPS ESURG 8.5IN CDMN ISCL BIPOLAR LOW PROF TIP 3.5INX2MM STRL LF  DISP (ENDOSCOPIC SUPPLIES) ×1 IMPLANT
GARMENT COMPRESS MED CALF CENTAURA NYL VASOGRAD LTWT BRTHBL SEQ FIL BLU 18- IN (MED SURG SUPPLIES) ×1 IMPLANT
GARMENT COMPRESS MED CALF CENT_AURA NYL VASOGRAD LTWT BRTHBL (MED SURG SUPPLIES) ×1
GOWN SURG LRG AAMI L3 NONREINF_ORCE SET IN SLEEVE HKLP CLSR (DRAPE/PACKS/SHEETS/OR TOWEL) ×1
GOWN SURG LRG L3 NONREINFORCE HKLP CLSR SET IN SLEEVE STRL LF  DISP BLU SIRUS SMS 43IN (DRAPE/PACKS/SHEETS/OR TOWEL) ×1 IMPLANT
KIT DRAIN 10FR PVC 3 SPRG RESERVOIR TROCAR Y CONN JP 1/8IN RND 400ML STRL LF  DISP (WOUND CARE SUPPLY) ×1 IMPLANT
KIT WND DRAIN 10FR_12/CS SU130402D (WOUND CARE/ENTEROSTOMAL SUPPLY) ×1
PACK BTRY VARISPEED (MED SURG SUPPLIES) ×1 IMPLANT
PACK SURG CRANI STRL DISP LF (CUSTOM TRAYS & PACK) ×1
PACK SURG TBG STRL DISP 30IN SIL LF (MED SURG SUPPLIES) ×2 IMPLANT
PATTIE SURG 1/2 X 1/2 20/CS_80-1400 (MED SURG SUPPLIES) ×1
PATTIE SURG 1/2IN X 3IN_CS/20 801407 (MED SURG SUPPLIES) ×1
PATTIE SURG 1/4 X 1/4 20/BX_801399 (MED SURG SUPPLIES) ×1
PATTIE SURG 1IN X 3IN_801408 CS/20 (MED SURG SUPPLIES) ×1
PATTIE SURG 3/4 X 3/4_CS/20 801401 (MED SURG SUPPLIES) ×1
PATTIE SURG XRAY DTBL 1 X 1_80-1403 (MED SURG SUPPLIES) ×1
PERFORATOR 11-14MM CRANIAL DGR-O SURG STRL DISP PED (SURGICAL CUTTING SUPPLIES) ×1 IMPLANT
PERFORATOR 14-11MM CRNL DGR-O_SURG STRL DISP PED (CUTTING ELEMENTS) ×1
PIN ADLT MAYFIELD WNG GRV PLAS_TIC CRNM SKULL DISP STRL (MED SURG SUPPLIES) ×1
PIN ADULT MAYFIELD WNG GRV PLASTIC CRNM SKULL DISP STRL (MED SURG SUPPLIES) ×1 IMPLANT
PLATE UNIV NEURO III 12MM 2 H LOW PROF BAR TAB TI CRNMXF DOG BONE NONST 1.5MM SCREW RETROSIGMOID (IMPLANTS NEUROLOGIC) ×3 IMPLANT
ROUTER SURG BLU RD 16MM 2.3MM_SPRL (CUTTING ELEMENTS) ×1
ROUTER SURG BLU RD 2.3MM SPRL LF (SURGICAL CUTTING SUPPLIES) ×1 IMPLANT
SCREW BONE UNIV NEURO 3 1.5MM 4MM SLF DRILL AX STAB CRANIAL NONST LF (IMPLANTS TRAUMA) ×6 IMPLANT
SET BIPOLAR CORD INTGR TUBE ROT MLS IRRG DISP (MED SURG SUPPLIES) IMPLANT
SET BIPOLAR CORD INTGR TUBE RO_T MLS IRRG DISP (MED SURG SUPPLIES)
SPHERE NAVIGATE (MED SURG SUPPLIES) ×2 IMPLANT
SPONGE SURG .25X.25IN PATTIE (MED SURG SUPPLIES) ×1 IMPLANT
SPONGE SURG .5X.5IN ABS PREC CUT RADOPQ PATTIE CTTND CDMN SUTUREWELD STRL LF  DISP (MED SURG SUPPLIES) ×1 IMPLANT
SPONGE SURG .75X.75IN ABS PREC CUT RADOPQ PATTIE CTTND CDMN SUTUREWELD STRL LF  DISP (MED SURG SUPPLIES) ×1 IMPLANT
SPONGE SURG 1X1IN ABS PREC CUT RADOPQ PATTIE CTTND CDMN SUTUREWELD STRL LF  DISP (MED SURG SUPPLIES) ×1 IMPLANT
SPONGE SURG 3X.5IN ABS PREC CUT RADOPQ PATTIE CTTND SUTUREWELD STRL LF  DISP (MED SURG SUPPLIES) ×1 IMPLANT
SPONGE SURG 3X1IN ABS PREC CUT RADOPQ PATTIE CTTND CDMN SUTUREWELD STRL LF  DISP (MED SURG SUPPLIES) ×1 IMPLANT
STAPLER SKIN 4.1X6.5MM 35 W STPL CART LF  APS U DISP CLR SS PLASTIC (SUTURE/WOUND CLOSURE) ×1 IMPLANT
STAPLER SKIN WIDE 35W_8886803712 12/BX (SUTURE/WOUND CLOSURE) ×1
TIP ASP 12CM 1.6MM 1.2MM SONOPET IQ MICRO SFT TISS STRL LF (MED SURG SUPPLIES)
TIP ASP 12CM 1.6MM 1.2MM SONOPET IQ MICRO SFT TISS STRL LF  DISP (MED SURG SUPPLIES) IMPLANT
TIP ASP 12CM 2.49MM 2MM SONOPET IQ LRG SFT TISS STRL LF (MED SURG SUPPLIES)
TIP ASP 12CM 2.49MM 2MM SONOPET IQ LRG SFT TISS STRL LF  DISP (MED SURG SUPPLIES) IMPLANT
TRAY CATH 16FR 1 LYR FOLEY DRAIN BAG TEMP SENSOR PERI WIPE SIL PVP 400ML 2.5L 10CC LF (UROLOGICAL SUPPLIES) IMPLANT
TRAY CATH 16FR 1 LYR FOLEY DRA_IN BAG TEMP SENSOR PERI WIPE (UROLOGICAL SUPPLIES)
TRAY CRANIOTOMY CUSTOM_CS/1 (CUSTOM TRAYS & PACK) ×1
TRAY STERILIZATION SPHERES (MED SURG SUPPLIES) ×2 IMPLANT
TRAY STRL SPHERES (MED SURG SUPPLIES) ×2
TUBING SILICONE 30IN (MED SURG SUPPLIES) ×2

## 2021-12-12 NOTE — Progress Notes (Signed)
[facility]  NEUROSURGERY   PROGRESS NOTE      Timm, Bonenberger, 31 y.o. male  Date of Admission:  12/12/2021  Date of Service: 12/12/2021  Date of Birth:  November 14, 1990    Referring Physician:  No ref. provider found    Post Op Day: Day of Surgery S/P Procedure(s) (LRB):  CRANIOTOMY FOR TUMOR WITH NAVIGATION (Right)    Chief Complaint: right temporal lesion  Subjective: Recovering from sugery    Vital Signs:  Temp (24hrs) Max:36 C (81.4 F)      Systolic (48JEH), UDJ:497 , Min:100 , WYO:378     Diastolic (58IFO), YDX:41, Min:59, Max:74    Temp  Avg: 36 C (96.8 F)  Min: 36 C (96.8 F)  Max: 36 C (96.8 F)  MAP (Non-Invasive)  Avg: 86 mmHG  Min: 86 mmHG  Max: 86 mmHG  Pulse  Avg: 50  Min: 50  Max: 50  Resp  Avg: 17  Min: 17  Max: 17  SpO2  Avg: 94 %  Min: 94 %  Max: 94 %       Min/Max/Avg ICP/CPP last 24hrs:   No data recorded    Today's Physical Exam:    Drowsy  PERRL  Face symmetric  MAEx4    -- Incision w/ staples      Assessment/ Plan:  Marquinn Meschke is a 31 y.o. male presenting with seizures and right temporal lesion s/p craniotomy for resection.    -- FU path    -- Imaging:    MRI brain w/wo: ORDERED   CT brain wo: ORDERED  -- Pain/spasm control: Tylenol PRN, Roxi PRN  -- Diet: regular  -- Bowel regimen, last BM     -- Abx: Oxacillin periop  -- Activity: ambulate as tolerated w/ assist  -- PT/OT: ordered   -- DVT ppx: SCDs/Venodynes  -- Consults:    none  -- Lines/Drains: none  -- Wound: staples  -- Disposition: TBD      Leta Baptist, MD  PGY-6 Neurosurgery  12/12/2021

## 2021-12-12 NOTE — OR Surgeon (Signed)
[facility] OF NEUROSURGERY  OPERATION SUMMARY     PATIENT NAME: [name] [name], [name] [name]  DATE OF BIRTH: 1990/11/16  HOSPITAL NUMBER: [id number]  DATE OF SERVICE: 12/12/2021     PREOPERATIVE DIAGNOSIS:  Right temporal brain lesion.     POSTOPERATIVE DIAGNOSIS:  1. Right temporal brain lesion.     NAME OF PROCEDURE:  Right temporal bone flap craniotomy; for excision of brain tumor, supratentorial.  Stereotactic computer-assisted (navigation) procedure for the cranial, intradural region, using the Medtronic StealthStation S8 Surgical Navigation System.  Use of intraoperative neuromonitoring.  Use of intraoperative microscope.     INTRAOPERATIVE FINDINGS:  Resection of two small right temporal lesions.     SURGEON:  Ashtin Melichar C. Radford Pax, MD, PhD.     ASSISTANT:  Leta Baptist, MD.     ANESTHESIA TYPE:  General endotracheal anesthesia.  Local anesthesia.     ESTIMATED BLOOD LOSS:  30 cc.     BLOOD GIVEN:   None.     FLUIDS GIVEN:  Crystalloid.     COMPLICATIONS:  None.     WOUND CLASS:  Clean Wound: Uninfected operative wound in which no inflammation occurred.     TUBES:  None.      DRAINS:  None.     SPECIMENS/CULTURES:  R temporal lesion x2.     IMPLANTS:  Stryker Universal Neuro III titanium plating system.     EXPLANTS:  None.     INDICATIONS FOR PROCEDURE:  [name] [name] is a 31 y.o. male with hx of seizures. W/u revealed two small T2 hyperintense lesions of the right temporal lobe without contrast enhancement. 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 Given hx of seizures, patient was recommended for resection of the lesions. Following a full discussion of risks and benefits patient opted to proceed with surgery.     DESCRIPTION OF PROCEDURE:  The patient was admitted to the operative theater, identified by neurosurgery, anesthesia, and the operating room staff. Magnetic resonance images depicting the intracranial pathology were displayed throughout the room. A timeout was completed to confirm the  patient. The patient was transported onto the slider operating room table. The patient underwent successful induction with general endotracheal anesthesia. The patient was administered abx for preoperative prophylaxis. The patient was rotated 180 degrees from anesthesia and remained in a supine position.  The head was then turned to the left and secured in the [place] frame with pins. All pressure points were found to be free of compression.      A CT scan Brain Lab protocol, having been obtained pre-operatively, was registered on the [place] and fused with the pre-existing MRI scan. Mapping of the navigation was performed. A minimal shave technique was employed. The surgical site was cleaned with alcohol and Chloroprep. The surgical site was draped in a sterile fashion.      Approximately 10 cc of local anesthetic in the form of 1% lidocaine with epinephrine (1:100000) was injected in the dermis layer of the planned incision. The incision was sharply made through the epidermis and dermis using a #15 scalpel blade. The underlying galea and temporalis was incised with the monopolar electrocautery down to the periosteum. Hemostasis was achieved with bipolar electrocautery. A self-retaining cerebellar retractor was placed.     Using the navigation system, the anterior posterior medial and lateral borders of the craniotomy were planned. A burr hole was placed using a Codman perforator bit. The remnant bone was removed using a Armed forces training and education officer. A dural elevator  was used to release the adherent dura from the overlying bone. A footplate craniotomy was used to raise the bone flap. The bone flap was placed in a sterile bowl filled with sterile saline. Hemostasis was achieved w/ bipolar electrocautery and Floseal.      Using a #15 scalpel and Metzenbaum scissors, the dura was opened in a 'C' fashion.  The dura was retracted using 4-0 Nurolon suture. The microscope was brought to the field.  The superior temporal sulcus was identified and trans-sulcal dissection of the anterior lesion was performed using Rhoton dissectors and microscissors. Having identified the lesion at the depth of the sulcus using stealth navigation, the lesion was resected using a pituitary rongeur. Tissue was taken around the presumed lesion location until satisfied that the lesion in entirety was removed. Attention was then turned to the posterior and superficial lesion. The lesion again was identified using neuronavigation. A small corticectomy was performed with bipolar electrocautery and then a wide excision of the lesion was performed using a pituitary rongeur. Satisfied with all lesion removal, hemostasis was achieved with bipolar electrocautery, hydrogen peroxide, and Surgicel.      The dura was reapproximated with 4-0 Neurolon suture. A large piece of Gelfoam was placed over the dura. The bone was plated with the Stryker plating system and secured in place.     Attention was then directed to closing the surgical wound.  The wound was closed by reapproximation of the temporal fascia and galea with dyed, inverted, interrupted 2-0 Vicryl stitches. The skin was re-approximated with staples. Bacitracin was applied to the wound and a sterile Primapore dressing. Upon completion of surgery, the patient was disengaged from the operating room table. The patient was extubated by anesthesia. The patient was transferred from the operating room table to the hospital bed and transported to the PACU for recovery.      All sponge counts were correct at the end of the procedure. Neuro-monitoring remained stable through the case.     Dr. Radford Pax was present for all key and critical portions of the procedure, and immediately available throughout the entire procedure.     DISPOSITION:    Stable condition to the PACU.    Leta Baptist, MD  PGY-6 Neurosurgery  12/12/2021     I was present for all key and critical portions of the procedure  including registration with navigation, trans-sulcal dissection, resection of the medial lesion, and resection of the superficial lesion.  I was immediately available during closure.    Madalina Rosman C. Radford Pax, MD, PhD  Department of Neurosurgery

## 2021-12-12 NOTE — Anesthesia Postprocedure Evaluation (Signed)
Anesthesia Post Op Evaluation    Patient: [name] [name]  Procedure(s) with comments:  CRANIOTOMY FOR TUMOR WITH NAVIGATION - TEMPORAL    Last Vitals:Temperature: 36.4 C (97.6 F) (12/12/21 1200)  Heart Rate: 52 (12/12/21 1430)  BP (Non-Invasive): 130/77 (12/12/21 1430)  Respiratory Rate: 15 (12/12/21 1430)  SpO2: 95 % (12/12/21 1430)    No notable events documented.    Patient is sufficiently recovered from the effects of anesthesia to participate in the evaluation and has returned to their pre-procedure level.  Patient location during evaluation: PACU       Patient participation: complete - patient participated  Level of consciousness: awake and alert and responsive to verbal stimuli    Pain management: adequate  Airway patency: patent    Anesthetic complications: no  Cardiovascular status: acceptable  Respiratory status: acceptable  Hydration status: acceptable  Patient post-procedure temperature: Pt Normothermic   PONV Status: Absent

## 2021-12-12 NOTE — Anesthesia Transfer of Care (Signed)
ANESTHESIA TRANSFER OF CARE   [name] [name] is a 30 y.o. ,male, Weight: 88.2 kg (194 lb 7.1 oz)   had Procedure(s) with comments:  CRANIOTOMY FOR TUMOR WITH NAVIGATION - TEMPORAL  performed  12/12/21   Primary Service: Thresa Ross, MD    Past Medical History:   Diagnosis Date    Anxiety     Attention deficit hyperactivity disorder (ADHD) 09/17/2021    Bradycardia     Depression     Headache     Hepatitis C     cured    Seizure (CMS HCC)     Smoker       Allergy History as of 12/12/21       BEE VENOM PROTEIN (HONEY BEE)         Noted Status Severity Type Reaction    09/17/21 1257 Farrell Ours, MA 11/19/20 Active    Other Adverse Reaction (Add comment)                  I completed my transfer of care / handoff to the receiving personnel during which we discussed:  Access, All key/critical aspects of case discussed, Airway, Analgesia, Antibiotics, Expectation of post procedure, Fluids/Product, Gave opportunity for questions and acknowledgement of understanding, Labs and PMHx      Post Location: PACU                        Additional Info:Patient transferred to PACU in stable condition. Vital signs stable. Report given to PACU RN. All questions answered. Patient care transferred to PACU team.    Marina Gravel, MD PGY-3, CA-2  Department  Of Anesthesiology  Pager # [phone]  12/12/2021, 09:51                                       Last OR Temp: Temperature: 36.4 C (97.5 F)  ABG:  POTASSIUM   Date Value Ref Range Status   11/13/2021 3.9 3.5 - 5.1 mmol/L Final     KETONES   Date Value Ref Range Status   11/13/2021 Negative Negative mg/dL Final     CALCIUM   Date Value Ref Range Status   10/12/2021 9.8 8.6 - 10.3 mg/dL Final     Calculated P Axis   Date Value Ref Range Status   11/13/2021 47 degrees Final     Calculated R Axis   Date Value Ref Range Status   11/13/2021 78 degrees Final     Calculated T Axis   Date Value Ref Range Status   11/13/2021 60 degrees Final     CANNAQL   Date Value Ref Range Status   10/12/2021  Positive (A) Negative Final     BENZO QL   Date Value Ref Range Status   10/12/2021 Negative Negative Final     Airway:* No LDAs found *  Blood pressure (!) 146/88, pulse 70, temperature 36.4 C (97.5 F), resp. rate 17, height 1.854 m ('6\' 1"'$ ), weight 88.2 kg (194 lb 7.1 oz), SpO2 100 %.

## 2021-12-12 NOTE — Nurses Notes (Signed)
Patient admitted to 1079 s/p R crani for resection. Hooked up to monitor. Call bell within reach. SS in place for safety.

## 2021-12-12 NOTE — H&P (Signed)
[facility]  H&P Update Form    [name], [name], 31 y.o. male  Encounter Start Date:  12/12/2021  Inpatient Admission Date: 12/12/2021  Date of Birth:  1990/06/05    12/12/2021    STOP: IF H&P IS GREATER THAN 30 DAYS FROM SURGICAL DAY COMPLETE NEW H&P IS REQUIRED.     H & P updated the day of the procedure.  1.  H&P completed within 30 days of surgical procedure and has been reviewed within 24 hours of admission but prior to surgery or a procedure requiring anesthesia services by Dr. Radford [name] on 7/20 , the patient has been examined, and no change has occured in the patients condition since the H&P was completed.       Change in medications: No                Comments:     2. COVID: N/A    3.  Patient continues to be appropiate candidate for planned surgical procedure. YES      [name] Leeds, MD  Neurological Surgery, PGY-2    Plan for R temporal craniotomy for resection of mass.    [name] Pablo C. Radford Pax, MD, PhD  Department of Neurosurgery

## 2021-12-12 NOTE — Brief Op Note (Signed)
[facility]                                                     BRIEF OPERATIVE NOTE    Patient Name: Yosiel, Thieme Number: [id number]  Date of Service: 12/12/2021   Date of Birth: 23-Jun-1990    All elements must be documented.    Pre-Operative Diagnosis: Right temporal brain tumor   Post-Operative Diagnosis: Same  Procedure(s)/Description: Right temporal craniotomy for resection of tumor  Findings/Complexity (inherent to the procedure performed): Two lesions resected from within the right superior temporal gyrus.     Attending Surgeon: Gery Pray. Radford Pax, MD, PhD  Assistant(s): Ann Lions. Purcell Nails, MD    Anesthesia Type: General  Estimated Blood Loss: less than 100 ml  Blood Given: None  Fluids Given: Crystalloid  Complications (not routinely expected or not inherent to difficulty/nature of procedure): None  Characteristic Event (routinely expected or inherent to the difficulty/nature of the procedure): Successful resection of tumor based on navigation.  Did the use of current and/or prior Anticoagulants impact the outcome of the case? no  Wound Class: Clean Wound: Uninfected operative wounds in which no inflammation occurred    Tubes: None  Drains: None  Specimens/ Cultures: Pathology; permanent  Implants: Stryker cranial plating hardware           Disposition: PACU - hemodynamically stable.  Condition: stable    Basir Niven C. Radford Pax, MD, PhD  Department of Neurosurgery

## 2021-12-12 NOTE — Anesthesia Procedure Notes (Signed)
[facility]    Arterial Line Procedure    Pt location: In OR  Consent:     Consent given by:  Patient    Risks discussed:  Bleeding and pain  Universal protocol:     Patient identity confirmed:  Verbally with patient, arm band and hospital-assigned identification number  Pre-procedure details:     Preparation: Preprocedure hand washing was performed; sterile field was maintained         Skin Prep used: Chlorhexidine gluconate  Anesthesia (see MAR for exact dosages):     Anesthesia method:  none    A 20 G Catheter type: Arrow 1 and 3/4 inch in length,  Placed on the left  radial artery  using anatomical landmarks, palpation, guidewire and Seldinger With  number of attempts:1.Secured with: transparent dressing   MEDICATIONS:     Post-procedure details:    Patient tolerance of procedure:  Tolerated well, no immediate complications blood withdrawn easily, flushes easily and good waveform  Complications:none  Performed By:  Performing provider: Marina Gravel, MD Authorizing provider: Mauro Kaufmann, MD

## 2021-12-13 ENCOUNTER — Inpatient Hospital Stay (HOSPITAL_COMMUNITY): Payer: 59

## 2021-12-13 DIAGNOSIS — G9389 Other specified disorders of brain: Secondary | ICD-10-CM

## 2021-12-13 DIAGNOSIS — Z9889 Other specified postprocedural states: Secondary | ICD-10-CM

## 2021-12-13 MED ORDER — GADOBUTROL 1 MMOL/ML (604.72 MG/ML) INTRAVENOUS SOLUTION
8.5000 mL | INTRAVENOUS | Status: AC
Start: 2021-12-13 — End: 2021-12-13
  Administered 2021-12-13: 8.5 mL via INTRAVENOUS

## 2021-12-13 MED ORDER — OXYCODONE 5 MG TABLET
5.0000 mg | ORAL_TABLET | ORAL | Status: DC | PRN
Start: 2021-12-13 — End: 2021-12-13

## 2021-12-13 MED ORDER — SENNOSIDES 8.6 MG-DOCUSATE SODIUM 50 MG TABLET
1.0000 | ORAL_TABLET | Freq: Every evening | ORAL | 0 refills | Status: DC
Start: 2021-12-13 — End: 2022-01-06

## 2021-12-13 MED ORDER — ACETAMINOPHEN 325 MG TABLET
650.0000 mg | ORAL_TABLET | ORAL | 0 refills | Status: DC | PRN
Start: 2021-12-13 — End: 2022-01-06

## 2021-12-13 MED ORDER — OXYCODONE 10 MG TABLET
10.0000 mg | ORAL_TABLET | ORAL | Status: DC | PRN
Start: 2021-12-13 — End: 2021-12-13
  Administered 2021-12-13 (×2): 10 mg via ORAL
  Filled 2021-12-13 (×2): qty 1

## 2021-12-13 MED ORDER — OXYCODONE 5 MG TABLET
5.0000 mg | ORAL_TABLET | Freq: Four times a day (QID) | ORAL | 0 refills | Status: DC | PRN
Start: 2021-12-13 — End: 2022-01-06

## 2021-12-13 NOTE — Care Plan (Signed)
[place]  Physical Therapy Initial Evaluation    Patient Name: [name] [name]  Date of Birth: 10/15/1990  Height: Height: 185.4 cm ('6\' 1"'$ )  Weight: Weight: 88.2 kg (194 lb 7.1 oz)  Room/Bed: 1079/A  Payor: [employer] / Plan: [facility]/[facility]R / Product Type: Non Managed Care /     Assessment:      (P) [name] [name] tolerated PT eval well. s/p craniotomy for resection.for R temporal lesion. Pt. is IND at baseline, and reports good family support upon d/c. He is primarily limited by pain / Headache symptoms. He completed all functional IND-SPV including: sup to sit, STS, amb. 250' without DME. At this time, PT recs home with assist once medically stable for d/c. There is no further need for skilled intervention in the acute care setting at this time. PT will sign-off please reconsult if needed.    Discharge Needs:    Equipment Recommendation: (P) none anticipated        Discharge Disposition: (P) home with assist    JUSTIFICATION OF DISCHARGE RECOMMENDATION   Based on current diagnosis, functional performance prior to admission, and current functional performance, this patient requires continued PT services in (P) home with assist in order to achieve significant functional improvements in these deficit areas:  .        Plan:   Current Intervention:    To provide physical therapy services    for duration of (P) evaluation only.    The risks/benefits of therapy have been discussed with the patient/caregiver and he/she is in agreement with the established plan of care.       Subjective & Objective        12/13/21 1005   Therapist Pager   PT Assigned/ Pager # Makylie Rivere [phone]   Rehab Session   Document Type evaluation   Total PT Minutes: 9   Patient Effort good   Symptoms Noted During/After Treatment none   General Information   Patient Profile Reviewed yes   Onset of Illness/Injury or Date of Surgery 12/12/21   Patient/Family/Caregiver Comments/Observations Pt. agreeable to PT   Pertinent History of Current  Functional Problem 31 y.o. male presenting with seizures and right temporal lesion s/p craniotomy for resection.   Medical Lines PIV Line;Telemetry   Respiratory Status room air   Existing Precautions/Restrictions full code;fall precautions   Mutuality/Individual Preferences   Anxieties, Fears or Concerns Reports no concerns for home d/c   Individualized Care Needs up ad lib per RN discression   Plan of Care Reviewed With patient   Living Environment   Lives With significant other   Living Arrangements mobile home   Home Accessibility bed and bath on same level   [place] Pt. lives in single story house with significant other Bre who took off work to provide assistance upon d/c. no STE.   Functional Level Prior   Ambulation 0 - independent   Transferring 0 - independent   Toileting 0 - independent   Bathing 0 - independent   Dressing 0 - independent   Eating 0 - independent   Prior Functional Level Comment IND no DME   Self-Care   Current Activity Tolerance good   Pre Treatment Status   Pre Treatment Patient Status Patient supine in bed;Call light within reach;Telephone within reach;Patient safety alarm activated;Nurse approved session   Support Present Pre Treatment  Family present   Communication Pre Treatment  Nurse   Communication Pre Treatment Comment RN medically cleared pt. for  therapy   Cognitive Assessment/Interventions   Behavior/Mood Observations behavior appropriate to situation, WNL/WFL   Orientation Status oriented x 4   Attention WNL/WFL   Follows Commands WFL   Vital Signs   Pre Treatment BP 122/91   Vitals Comment no acute s/s of distress   Pain Assessment   Pretreatment Pain Rating 7/10   Pre/Posttreatment Pain Comment c/o unchanging HA .   RLE Assessment   RLE Assessment WFL- Within Functional Limits   LLE Assessment   LLE Assessment WFL- Within Functional Limits   Bed Mobility Assessment/Treatment   Bed Mobility, Assistive Device Head of Bed Elevated   Supine-Sit Independence  modified independence   Impairments pain   Comment no overt LOB or major safety concerns   Transfer Assessment/Treatment   Sit-Stand Independence supervision required   Stand-Sit Independence supervision required   Transfer Impairments pain   Transfer Comment no overt LOB or major safety concenrs   Gait Assessment/Treatment   Total Distance Ambulated 250   Independence  supervision required   Distance in Feet 250   Impairments  pain   Comment v/c to slow down w/ turns   Balance Skill Training   Comment unsupported   Sitting Balance: Static good balance   Sitting, Dynamic (Balance) good balance   Sit-to-Stand Balance good balance   Standing Balance: Static good balance   Standing Balance: Dynamic good balance   Therapeutic Exercise/Activity   Comment Pt. education provided on taking time with transitions upon d/c as well as taking time with amb and turns due to allow for optimal recovery and safety. Dynamic standing balance activities for ADLS. SBA for balance   Post Treatment Status   Post Treatment Patient Status Patient sitting in bedside chair or w/c;Call light within reach;Telephone within reach;Patient safety alarm activated   Support Present Post Treatment  Family present   Plan of Care Review   Plan Of Care Reviewed With patient   Basic Mobility Am-PAC/6Clicks Score (APPROVED Staff)   Turning in bed without bedrails 4   Lying on back to sitting on edge of flat bed 4   Moving to and from a bed to a chair 4   Standing up from chair 4   Walk in room 4   Climbing 3-5 steps with railing 4   6 Clicks Raw Score total 24   Standardized (t-scale) score 57.68   Patient Mobility Goal (JHHLM) 8- Walk 250 feet or more 3X/day   Exercise/Activity Level Performed 8- Walked 250 feet or more   Physical Therapy Clinical Impression   Assessment Mr. [name] tolerated PT eval well. s/p craniotomy for resection.for R temporal lesion. Pt. is IND at baseline, and reports good family support upon d/c. He is primarily limited by pain /  Headache symptoms. He completed all functional IND-SPV including: sup to sit, STS, amb. 250' without DME. At this time, PT recs home with assist once medically stable for d/c. There is no further need for skilled intervention in the acute care setting at this time. PT will sign-off please reconsult if needed.   Patient/Family Goals Statement to go home   Criteria for Skilled Therapeutic no problems identified which require skilled intervention   Predicted Duration of Therapy Intervention (days/wks) evaluation only   Anticipated Equipment Needs at Discharge (PT) none anticipated   Anticipated Discharge Disposition home with assist   Evaluation Complexity Justification   Patient History: Co-morbidity/factors that impact Plan of Care 1-2 that impact Plan of Care;Surgical procedure: causing pain &/or impaired  function   Examination Components 4 or more Exam elements addressed;Range of motion;Strength;Balance;Transfers;Bed mobility;Ambulation   Presentation Stable: Uncomplicated, straight-forward, problem focused   Clinical Decision Making Low complexity   Evaluation Complexity Low complexity       Therapist:   Donata Duff, PT ,DPT  Pager #: [phone]

## 2021-12-13 NOTE — Care Plan (Signed)
[place]  Occupational Therapy Initial Evaluation    Patient Name: [name] [name]  Date of Birth: Jan 29, 1991  Height: Height: 185.4 cm (_0 )  Weight: Weight: 88.2 kg (194 lb 7.1 oz)  Room/Bed: 1079/A  Payor: PEIA / Plan: PEIA/UMR / Product Type: Non Managed Care /     Assessment:   Pt tolerated OT eval well this date with only minor deficits due to pain and decreased balance. Pt is functioning close to baseline and demonstrates good insight into limitations. Completing all mobility and ADLs with Sup-Mod I. No needs from OT perspective at this time; OT will sign off.      Discharge Needs:   Equipment Recommendation: none anticipated     Discharge Disposition: home with assist    JUSTIFICATION OF DISCHARGE RECOMMENDATION   Based on current diagnosis, functional performance prior to admission, and current functional performance, this patient requires continued OT services in home with assist  in order to achieve significant functional improvements.    Plan:   Current Intervention:      To provide Occupational therapy services Evaluation Only, evaluation only.       The risks/benefits of therapy have been discussed with the patient/caregiver and he/she is in agreement with the established plan of care.       Subjective & Objective        12/13/21 1004   Therapist Pager   OT Assigned/ Pager # [phone]   Rehab Session   Document Type evaluation   Total OT Minutes: 9   Patient Effort good   Symptoms Noted During/After Treatment none   General Information   Patient Profile Reviewed yes   Onset of Illness/Injury or Date of Surgery 12/12/21   Pertinent History of Current Functional Problem 31 y.o. male presenting with seizures and right temporal lesion s/p craniotomy for resection.   Medical Lines PIV Line;Telemetry   Respiratory Status room air   Existing Precautions/Restrictions full code;fall precautions   Pre Treatment Status   Pre Treatment Patient Status Patient supine in bed;Call  light within reach;Telephone within reach;Patient safety alarm activated;Nurse approved session   Support Present Pre Treatment  Family present   Communication Pre Treatment  Nurse   Communication Pre Treatment Comment aware of session   Mutuality/Individual Preferences   Individualized Care Needs IND for OOB mobility and ADLs   Patient-Specific Goals (Include Timeframe) d/c home   Plan of Care Reviewed With patient   Living Environment   Lives With spouse   Living Arrangements mobile home   Home Accessibility no concerns   Living Environment Comment Pt lives in 1 story home with 0 STE and walk in shower and built-in seat. Pt denies DME and hx of falls.   Functional Level Prior   Ambulation 0 - independent   Transferring 0 - independent   Toileting 0 - independent   Bathing 0 - independent   Dressing 0 - independent   Eating 0 - independent   Vital Signs   Pre Treatment BP 122/91   Vitals Comment VSS   Pain Assessment   Additional Documentation Pain Scale: Numbers Pre/Post-Treatment (Group)   Pain Assessment   Pretreatment Pain Rating 7/10   Pre/Posttreatment Pain Comment c/o headache   Family/Support System   Family/Support Persons family   Involvement in Care at bedside;interacting with patient;supportive of patient   Cognitive Assessment/Interventions   Behavior/Mood Observations behavior appropriate to situation, WNL/WFL   Orientation Status oriented x 4   Attention  WNL/WFL   Follows Commands WNL   RUE Assessment   RUE Assessment WFL- Within Functional Limits   LUE Assessment   LUE Assessment WFL- Within Functional Limits   Mobility Assessment/Training   Mobility Comment Pt completed bed mobility, transfers, and community distance ambulation with no AD and Sup A. no LOB noted   Bed Mobility Assessment/Treatment   Bed Mobility, Assistive Device Head of Bed Elevated   Supine-Sit Independence modified independence   Impairments pain   Transfer Assessment/Treatment   Sit-Stand Independence supervision required    Stand-Sit Independence supervision required   Transfer Impairments pain   Upper Body Dressing Assessment/Training   Position  sitting   DRESSING ASSESSED Don Shirt-button up   Independence Level  supervision required   Impairments  pain   Lower Body Dressing Assessment/Training   Position sitting;standing   DRESSING ASSESSED Don Pants- pull up;Don Pants- fasten   Independence Level  supervision required   Impairments pain;balance impaired   Balance Skill Training   Sitting Balance: Static good balance   Sitting, Dynamic (Balance) good balance   Sit-to-Stand Balance good balance   Standing Balance: Static good balance   Standing Balance: Dynamic fair + balance   Post Treatment Status   Post Treatment Patient Status Patient sitting in bedside chair or w/c;Call light within reach;Telephone within reach;Patient safety alarm activated   Support Present Post Treatment  Family present   Clinical Impression   Functional Level at Time of Session Pt tolerated OT eval well this date with only minor deficits due to pain and decreased balance. Pt is functioning close to baseline and demonstrates good insight into limitations. Completing all mobility and ADLs with Sup-Mod I. No needs from OT perspective at this time; OT will sign off.   Criteria for Skilled Therapeutic Interventions Met (OT) no problems identified which require skilled intervention   Therapy Frequency Evaluation Only   Predicted Duration of Therapy evaluation only   Anticipated Equipment Needs at Discharge none anticipated   Anticipated Discharge Disposition home with assist   Highest level of Mobility score   Exercise/Activity Level Performed 8- Walked 250 feet or more   Evaluation Complexity Justification   Occupational Profile Review Brief history   Performance Deficits 1-3 deficits   Clinical Decision Making Low analytic complexity   Evaluation Complexity Low   Discharge Summary, OT Eval   Reason for Discharge no further needs identified   Outcomes Achieved  all goals met within established timeframes       Therapist:   Lanna Poche, OT   Pager #: [phone]

## 2021-12-13 NOTE — Care Plan (Signed)
DC orders in. AVS reviewed. Scripts given to patient. Patient to be transported home with family.

## 2021-12-13 NOTE — Progress Notes (Signed)
[facility]  NEUROSURGERY   PROGRESS NOTE      [name], [name], 31 y.o. male  Date of Admission:  12/12/2021  Date of Service: 12/13/2021  Date of Birth:  [birth year]    Referring Physician:  No ref. provider found    Post Op Day: 1 Day Post-Op S/P Procedure(s) (LRB):  CRANIOTOMY FOR TUMOR WITH NAVIGATION (Right)    Chief Complaint: right temporal lesion  Subjective: Endorses significant headache    Vital Signs:  Temp (24hrs) Max:37.7 C (58.8 F)      Systolic (50YDX), AJO:878 , Min:100 , MVE:720     Diastolic (94BSJ), GGE:36, Min:59, Max:93    Temp  Avg: 36.8 C (98.2 F)  Min: 36 C (96.8 F)  Max: 37.7 C (99.9 F)  MAP (Non-Invasive)  Avg: 91.4 mmHG  Min: 76 mmHG  Max: 104 mmHG  Pulse  Avg: 58.2  Min: 39  Max: 70  Resp  Avg: 16.1  Min: 13  Max: 20  SpO2  Avg: 95.7 %  Min: 92 %  Max: 100 %       Min/Max/Avg ICP/CPP last 24hrs:   No data recorded    Today's Physical Exam:    Appears stated age, NAD  A&Ox3  GCS '4 6 5    '$ Fluent speech  Appropriate fund of knowledge  Appropriate attention span & concentration  Appropriate recent and remote memory    Sclera white  Trachea midline  Regular respirations  Skin well perfused  Mouth symmetric    CN 2 PERRL  CN '3 4 6 '$ EOMI  CN 5 V1-V3 sensation intact  CN 7 Face symmetric  CN 8 Hearing grossly intact  CN 11 Shrug symmetric  CN 12 Tongue midline    Muscle Strength 5/5 BUE and BLE  Muscle tone WNL  SILT BUE and BLE  No drift  No hoffman   Plantars downgoing  No clonus  Gait station not assessed     -- Incision w/ staples      Assessment/ Plan:  [name] [name] is a 31 y.o. male presenting with seizures and right temporal lesion s/p craniotomy for resection.    -- FU path    -- Imaging:    MRI brain w/wo: ORDERED   CT brain wo(12/12/21): expected post op  changes  -- Pain/spasm control: Tylenol PRN, Roxi PRN  -- Diet: regular  -- Bowel regimen, last BM  (PTA)   -- Abx: Oxacillin periop  -- Activity: ambulate as tolerated w/ assist  -- PT/OT: ordered   -- DVT ppx:  SCDs/Venodynes  -- Consults:    none  -- Lines/Drains: none  -- Wound: staples  -- Disposition: TBD      Ofilia Neas, MD  PGY3, Neurosurgery

## 2021-12-13 NOTE — Discharge Summary (Signed)
[facility]  DISCHARGE SUMMARY    PATIENT NAME:  [name] [name], [name] [name]  MRN:  [id number]  DOB:  [birth year]    ENCOUNTER DATE:  12/12/2021  INPATIENT ADMISSION DATE: 12/12/2021  DISCHARGE DATE:  12/13/2021    ATTENDING PHYSICIAN: Thresa Ross, MD  SERVICE: NEUROSURGERY 2  PRIMARY CARE PHYSICIAN: Daiva Huge, DO       Lay Caregiver Name: Tremane Spurgeon   Lay Caregiver Contact Number: [phone]   Lay Caregiver Relationship to patient: spouse/significant other    PRIMARY DISCHARGE DIAGNOSIS: Mass of brain  Active Hospital Problems    Diagnosis Date Noted    Principal Problem: R temporal brain lesion s/p crani for resection [G93.89] 12/12/2021      Resolved Hospital Problems   No resolved problems to display.     Active Non-Hospital Problems    Diagnosis Date Noted    Attention deficit hyperactivity disorder (ADHD) 09/17/2021    History of substance abuse (CMS [place]) 09/17/2021    Seizure (CMS [place]) 09/17/2021    Vitamin D deficiency 09/17/2021    Acute appendicitis 08/25/2021    Viral hepatitis C 01/17/2018           Current Discharge Medication List        START taking these medications.        Details   acetaminophen 325 mg Tablet  Commonly known as: TYLENOL   650 mg, Oral, EVERY 4 HOURS PRN  Refills: 0     oxyCODONE 5 mg Tablet  Commonly known as: ROXICODONE   5 mg, Oral, EVERY 6 HOURS PRN  Qty: 20 Tablet  Refills: 0     sennosides-docusate sodium 8.6-50 mg Tablet  Commonly known as: SENOKOT-S   1 Tablet, Oral, EVERY EVENING  Qty: 7 Tablet  Refills: 0            CONTINUE these medications - NO CHANGES were made during your visit.        Details   ARIPiprazole 10 mg Tablet  Commonly known as: ABILIFY   10 mg, Oral, DAILY  Qty: 90 Tablet  Refills: 0     carBAMazepine 200 mg Tablet Sustained Release 12 hr  Commonly known as: TEGRETOL XR   400 mg, Oral, 2 TIMES DAILY  Qty: 360 Tablet  Refills: 1            ASK your doctor about these medications.        Details   cloNIDine HCL 0.1 mg Tablet  Commonly known as: CATAPRES    0.1 mg, 2 TIMES DAILY  Refills: 0     lacosamide 100 mg Tablet  Commonly known as: VIMPAT   Week 1: take 50 mg twice per day; Week 2 Take 50 mg in the morning and 100 mg at night: Week 3: take 100 mg twice per day and continue this dose  Qty: 180 Tablet  Refills: 1            Discharge med list refreshed?  YES   NEUROSURGERY RISK FACTORS:  right temporal lesion s/p craniotomy for resection     Allergies   Allergen Reactions    Bee Venom Protein (Honey Bee)  Other Adverse Reaction (Add comment)     HOSPITAL PROCEDURE(S):   No orders of the defined types were placed in this encounter.    Surgical/Procedural Cases on this Admission       Case IDs Date Procedure Surgeon Location Status    [id number] 12/12/21 CRANIOTOMY FOR TUMOR WITH  NAVIGATION Thresa Ross, MD [place] OR [place] COURSE   BRIEF HPI:  This is a 31 y.o., male admitted for resection of R temporal lesion.  BRIEF HOSPITAL NARRATIVE:      The patient underwent elective resection of R temporal lesion 8/18 and was determined safe for discharge on 8/19.  Prior to discharge the patient was tolerating a diet, his pain was well controlled on oral pain medications, and he was ambulating well.  The patient will follow up with Neurosurgery in 2 weeks for reevaluation.  All questions were answered prior to discharge and the patient agreed to be discharged at this time. The patient was instructed to follow up sooner for new or concerning symptoms.        TRANSITION/POST DISCHARGE CARE/PENDING TESTS/REFERRALS:   Patient will follow up in neurosurgery clinic in 2 weeks with a CT brain wo.    CONDITION ON DISCHARGE:  A. Ambulation: Full ambulation  B. Self-care Ability: Complete  C. Cognitive Status Alert and Oriented x 3  D. Code status at discharge:       LINES/DRAINS/WOUNDS AT DISCHARGE:   Patient Lines/Drains/Airways Status       Active Line / Dialysis Catheter / Dialysis Graft / Drain / Airway / Wound       Name Placement  date Placement time Site Days    Peripheral IV Right Median Antebrachial 12/12/21  --  -- 1    Surgical Incision Right;Upper Head 12/12/21  --  -- 1    Surgical Incision Cranial Pins 12/12/21  --  -- 1                    DISCHARGE DISPOSITION:  Home discharge  DISCHARGE INSTRUCTIONS:  Post-Discharge Follow Up Appointments       Follow up with Neurosurgery, [facility]    Phone: [phone]    Where: [address] [place] [address]      Tuesday Jan 06, 2022    Return Patient Visit with Alyson Locket, MD at  1:00 PM    Return Patient Visit with Nurse, [place] at  1:00 PM      Neurology, [facility]  [facility], [place] [address]  [facility], [place], [place]  [address]  [place] [address]  [phone]             [place] IV CONTRAST           Reason for Exam: post op eval    What is the patient's weight in lbs? 88 kg (194 lb) (12/12/2021)    Anesthesia required? No      DISCHARGE INSTRUCTION - MISC    You may shower, however, do not immerse the incision sites in water such as bathing or swimming. If you have a bandage, it may be removed 2 days after you are discharged. Leave the incision site open to air and keep clean. No lifting greater than 10 pounds for 2 weeks. No driving while taking narcotic pain medications. Please call the neurosurgery department or go to the nearest emergency room for signs of incisional infection (fevers, chills, incisional opening, drainage, or redness and swelling around incisions), uncontrolled nausea and vomiting, or any other concerns. No NSAIDs such as naproxen, Toradol and Ibuprofen.  Staples are in place and will be removed at your follow-up appointment.     [place]     Follow-up in: 2 WEEKS    Reason for visit: POST-OP VISIT     Follow-up reason: post op tumor resection    Coordination with other outpt. appts.? YES (provide details in comment section & place separate orders for those items) CT   Provider: Dr. Radford Pax           Copies sent to Care Team         Relationship Specialty Notifications Start End    Daiva Huge, DO PCP - General EXTERNAL  10/17/19     Phone: [phone] Fax: [phone]         [facility] [place] [address]            Referring providers can utilize [email or web address] to access their referred [place] patient's information.         Rochel Brome, MD  Neurosurgery PGY1

## 2021-12-13 NOTE — Progress Notes (Signed)
Opioid Prescription - First Prescription  Diagnosis requiring prescription: right temporal lesion s/p craniotomy for resection     Medication Dosage/Frequency being prescribed:   -- Oxycodone, 1 tablet, oral, q4h PRN for severe, breakthrough pain x 5 days; 20 tablets total no refills      I have reviewed prior medication history in the medical record for this patient.  I am unable to review information contained in the state controlled prescription drug monitoring database.    I have discussed any history of non-pharmacological treatment with the patient. The patient reports no prior treatment history.      The patient denies history of substance abuse treatment.      Physical exam findings and/or clinical history warranting use of opioid treatment include:   -- Head pain  -- Neck pain  -- Limitation in ADLs due to associated pain  -- Failure of non-narcotic pain medications to adequately control pain  -- Acute pain control secondary to recent surgery    My goals for treatment include:   -- Limit acute pain in an attempt to facilitate recovery from surgery  -- Control and reduce pain to an acceptable level  -- Reduce neuro-endocrine stress  -- Minimize impact of pain on recovery activities  -- Prevent chronic pain    I have discussed the risk of opioid addiction with the patient.  I have also discussed the risk of using sedatives and alcohol while taking opioids.      [name] Brome, MD  Neurosurgery PGY1    This note is being signed as a cross-cover activity.  I did not provide surgical care to this patient.  Needs follow-up with primary neurosurgeon.

## 2021-12-24 DIAGNOSIS — G9389 Other specified disorders of brain: Secondary | ICD-10-CM

## 2021-12-24 DIAGNOSIS — C712 Malignant neoplasm of temporal lobe: Secondary | ICD-10-CM

## 2021-12-24 LAB — SURGICAL PATHOLOGY SPECIMEN

## 2021-12-25 ENCOUNTER — Encounter (INDEPENDENT_AMBULATORY_CARE_PROVIDER_SITE_OTHER): Payer: Self-pay | Admitting: Student in an Organized Health Care Education/Training Program

## 2021-12-25 ENCOUNTER — Inpatient Hospital Stay (HOSPITAL_BASED_OUTPATIENT_CLINIC_OR_DEPARTMENT_OTHER)
Admission: RE | Admit: 2021-12-25 | Discharge: 2021-12-25 | Disposition: A | Discharge status: Home / Self Care | Payer: 59 | Source: Ambulatory Visit | Attending: Student in an Organized Health Care Education/Training Program | Admitting: Student in an Organized Health Care Education/Training Program

## 2021-12-25 ENCOUNTER — Other Ambulatory Visit: Payer: Self-pay

## 2021-12-25 ENCOUNTER — Ambulatory Visit
Payer: 59 | Attending: Student in an Organized Health Care Education/Training Program | Admitting: Student in an Organized Health Care Education/Training Program

## 2021-12-25 VITALS — BP 140/70 | HR 62 | Temp 96.6°F | Ht 73.0 in | Wt 194.2 lb

## 2021-12-25 DIAGNOSIS — Z9889 Other specified postprocedural states: Secondary | ICD-10-CM

## 2021-12-25 DIAGNOSIS — G9389 Other specified disorders of brain: Secondary | ICD-10-CM | POA: Insufficient documentation

## 2021-12-25 DIAGNOSIS — C712 Malignant neoplasm of temporal lobe: Secondary | ICD-10-CM

## 2021-12-25 DIAGNOSIS — D332 Benign neoplasm of brain, unspecified: Secondary | ICD-10-CM | POA: Insufficient documentation

## 2021-12-25 NOTE — Progress Notes (Unsigned)
Brief Note:    [name] [name] is a 31 year old male that presents for follow-up following a right temporal craniotomy for resection of tumor on 12/12/2021.  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  No further treatment at this time planned.    He is doing well post-operatively.  He has some headache but otherwise doing well.  We discussed his pathology report.    Plan for follow-up in 1 year w/ MRI Brain W/WO.    [name] C. Radford Pax, MD, PhD  Department of Neurosurgery

## 2021-12-25 NOTE — Progress Notes (Unsigned)
[facility]  Return Outpatient Note    Date:  12/25/2021  Age:  31 y.o.  Referring Physician:   Thresa Ross, MD  [facility]  [place],  [place] [address]    POST OP VISIT #1    Subjective:   31 year old right handed male who is s/p right temporal craniotomy for tumor resection by Dr. Radford Pax on 12/12/21 (pathology: low grade glioneuronal tumor). Here for first post op visit.    Doing well overall. Reports right sided head pain. No incisional drainage or fevers. On AED per Neurology: no recent seizures. He reports fatigue. The patient denies acute vision changes, dizziness, syncope, unilateral weakness/numbness, acute cognitive changes, recent falls/injuries, dysphagia, incontinence, or hearing loss. He has been applying for disability (mental health conditions). He vapes.       Objective:   Vital Signs:  BP (!) 140/70   Pulse 62   Temp (!) 35.9 C (96.6 F)   Ht 1.854 m ('6\' 1"'$ )   Wt 88.1 kg (194 lb 3.6 oz)   BMI 25.62 kg/m       Constitutional  General appearance: Normal  Cardiovascular:   Carotids: no bruits  Musculoskeletal  Gait and Station: : Normal  Muscle strength (upper extremities): : Normal  Muscle strength (lower extremities): : Normal  Incisions Good healing; no erythema, warmth, drainage   Neurological  Orientation: Normal  Attention span and concentration: Normal  Language: Normal    Data reviewed  Head CT from today was reviewed and compared to prior images stable post op findings     Assessment:      ICD-10-CM    1. Brain tumor (benign) (CMS HCC)  D33.2 MRI BRAIN W/WO CONTRAST     SAME DAY SUTURE/STAPLE REMOVAL-NO CHARGE(AMB ONLY) [[id number]]          Recommendations  -The patient is making gains since surgery.   -Staples/sutures were removed without incident by our nursing staff.  Wound care was reviewed.   -F/u with Neurology as planned for management of AED.  -RTC here in 1 year with APP with MRI brain w/wo contrast, sooner if problems develop.     -A copy  of the note from today's clinic appointment will be sent via fax or mail to the patient's PCP and/or referring physician on file.       Sharman Crate, PA-C 12/25/2021, 10:12    The patient was seen as a shared visit with the co-signing faculty.

## 2021-12-25 NOTE — Nursing Note (Signed)
The Physician/Mid level provider has checked the wound area and supports that it looks to be healing well.  The incision site was prepped and cleansed prior to the removal of the staples.  All of the staples were accounted for and removed without incident. The incision was well approximated, and free of any signs of infection.  The incision was cleansed again after removal, a very thin layer of antibiotic ointment or bacitracin was applied to the wound area.  I provided the patient with verbal incision/wound care instructions, explained about infection control, leaving the incision air to dry, resuming normal hygiene routine, and not to submerge the area in water for a prolonged period until after the 2nd post op visit. I also notified the patient that they will receive a printed copy of instructions with their AVS upon check out.    Cletis Media, Ambulatory Care Assistant  12/25/2021, 10:22

## 2021-12-26 DIAGNOSIS — G936 Cerebral edema: Secondary | ICD-10-CM

## 2021-12-26 DIAGNOSIS — Z9889 Other specified postprocedural states: Secondary | ICD-10-CM

## 2022-01-06 ENCOUNTER — Encounter (INDEPENDENT_AMBULATORY_CARE_PROVIDER_SITE_OTHER): Payer: Self-pay | Admitting: Student in an Organized Health Care Education/Training Program

## 2022-01-06 ENCOUNTER — Telehealth: Payer: 59 | Admitting: Student in an Organized Health Care Education/Training Program

## 2022-01-06 ENCOUNTER — Other Ambulatory Visit: Payer: Self-pay

## 2022-01-06 VITALS — HR 72 | Temp 97.9°F | Resp 16 | Ht 73.0 in | Wt 191.4 lb

## 2022-01-06 DIAGNOSIS — Z9889 Other specified postprocedural states: Secondary | ICD-10-CM

## 2022-01-06 DIAGNOSIS — G40109 Localization-related (focal) (partial) symptomatic epilepsy and epileptic syndromes with simple partial seizures, not intractable, without status epilepticus: Secondary | ICD-10-CM

## 2022-01-06 DIAGNOSIS — Z86011 Personal history of benign neoplasm of the brain: Secondary | ICD-10-CM
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
## 2022-01-28 ENCOUNTER — Other Ambulatory Visit: Payer: Self-pay | Admitting: NURSE PRACTITIONER

## 2022-01-28 DIAGNOSIS — R569 Unspecified convulsions: Secondary | ICD-10-CM

## 2022-01-28 DIAGNOSIS — G9389 Other specified disorders of brain: Secondary | ICD-10-CM

## 2022-03-04 ENCOUNTER — Encounter (INDEPENDENT_AMBULATORY_CARE_PROVIDER_SITE_OTHER): Payer: Self-pay | Admitting: Student in an Organized Health Care Education/Training Program

## 2022-03-05 MED ORDER — LACOSAMIDE 100 MG TABLET
ORAL_TABLET | ORAL | 2 refills | Status: DC
Start: 2022-03-05 — End: 2022-05-05

## 2022-04-22 ENCOUNTER — Encounter (INDEPENDENT_AMBULATORY_CARE_PROVIDER_SITE_OTHER): Payer: Self-pay | Admitting: NEUROLOGY

## 2022-05-05 ENCOUNTER — Ambulatory Visit (INDEPENDENT_AMBULATORY_CARE_PROVIDER_SITE_OTHER): Payer: 59 | Admitting: NEUROLOGY

## 2022-05-05 ENCOUNTER — Encounter (INDEPENDENT_AMBULATORY_CARE_PROVIDER_SITE_OTHER): Payer: Self-pay | Admitting: NEUROLOGY

## 2022-05-05 ENCOUNTER — Other Ambulatory Visit: Payer: Self-pay

## 2022-05-05 DIAGNOSIS — G9389 Other specified disorders of brain: Secondary | ICD-10-CM

## 2022-05-05 DIAGNOSIS — G40909 Epilepsy, unspecified, not intractable, without status epilepticus: Secondary | ICD-10-CM

## 2022-05-05 DIAGNOSIS — R569 Unspecified convulsions: Secondary | ICD-10-CM

## 2022-05-05 MED ORDER — BRIVARACETAM 50 MG TABLET
50.0000 mg | ORAL_TABLET | Freq: Two times a day (BID) | ORAL | 0 refills | Status: AC
Start: 2022-05-05 — End: 2022-06-04

## 2022-05-05 NOTE — Progress Notes (Unsigned)
ASSESSMENT  EPILEPSY: He is a 32 year old man who is referred for longstanding history of epilepsy secondary to right anterior temporal low grade glioneuronal tumor now s/p resection. Neurologic exam is nonfocal. Seizure frequency has increased since resection. He is no extremely uncontrolled having multiple GTC events per week. He is on Tegretol '400mg'$  BID and recently on Vimpat though this he stopped taking after a discussion with a pharmacist. This is a very complicated case as he also has a history of hepatitis C. It has only partially been treated. I would like to limit further hepatically metabolized meds until he re-establishes with GI to ensure his HCV is cleared. He was previously seen in [place] but has significant transportation issues as he cannot drive due to his epilepsy. I do not think lacosamide is the best adjunct therapy as mechanisms of action are not dissimilar from Tegretol. He has been on Keppra previously and experienced mood issues. I would prefer to avoid benzodiazepine use given listed substance use history. We will try to get him approved for Briviact as this tends to have less mood effect profile than Keppra. I have placed a social work consult to see if we can help him navigate his transportation issues. If he is unable to get back to [place], will refer him to GI specialist closer. Today we discussed the importance of medication compliance and the risk of sudden unexplained death in epilepsy patients.    2.   GLIONEURONAL TUMOR: He was discovered to have a low-grade glioneuronal tumor confirmed by pathology s/p resection August 18th, 2023. It's possible that this represents a DNET given the presentation. It was felt less likely to represent a ganglioglioma given BRAF negativity. Ultimately, I think follow-up with a neuro-oncologist at some point would be preferable though there is no way for him to get to an appropriate academic center at this time. I think it's pertinent to obtain  an interval MRI early (next planned scan with NSGY is August 2024) in the setting of worsening seizure activity. Included perfusion studies would be preferable, but I don't think that will be obtainable locally. I have already checked with our MR department and it is unavailable.     PLAN  Continue Tegretol '400mg'$  BID        Start Briviact 50 mg BID, if approved and tolerating will plan to increase to 100 mg BID        Patient not driving, we discussed [place] and [place] state driving laws        Counseled patient on SUDEP and importance of medication compliance        Social work referral placed  2.   Obtain MRI Brain w & w/o contrast         If any recurrence, will do our best to get patient back to [facility] as well as to a neuro-oncologist     Thank you for allowing me to participate in your patient's care and please do not hesitate to contact me for any questions or concerns.    Adrian Prows, DO  Assistant Professor of Neurology  [facility]     I personally spent a total of 60 minutes today preparing to see the patient, in the encounter with the patient, and documenting after the visit.    ==========================================================================================================================================    NAME:  [name] [name]  DOB:  [birth year]  VISIT DATE:  05/05/2022    CC:  Epilepsy    Patient seen  in consultation at the request of Renard Matter, FNP  History obtained from the patient and chart/records  Age of patient:  32 y.o.      HPI:   I had the pleasure of seeing your patient in neurology clinic for an outpatient consultation, who is a 32 y.o. year old male who was referred for evaluation of epilepsy.  Please allow me to summarize the history for the record.    He states that seizures began at age 52 and were characterized by abrupt loss of consciousness "falling out." He was first placed on Keppra. He is not quite sure why it was stopped but thinks  it was mood side effects.He has since been on monotherapy with Tegretol.  He reports that in 2010 his seizures began to change into more frank GTC events. A right anterior temporal lobe mass was discovered recently, and in August of this year, he underwent resection in [place]. He tolerated the procedure well and post-op images appeared as expected.  Since resection, if anything seizures have worsened. He will have multiple GTCs per week. He knows there is convulsive activity, and it sounds like there is focal movement of the left side but he doesn't have a great description of the events. They are characterized by tongue biting. He is post-ictal typically until the following day. Vimpat was added to therapy by his previous neurologist Dr. Velna Hatchet, however, he discontinued this due to some discussion with a pharmacist and the fact that he felt it wasn't helping.    ============================================================================================================================================  PMHx  Patient Active Problem List   Diagnosis    Acute appendicitis    Attention deficit hyperactivity disorder (ADHD)    History of substance abuse (CMS HCC)    Seizure (CMS [place])    Viral hepatitis C    Vitamin D deficiency    R temporal brain lesion s/p crani for resection     Past Surgical History:   Procedure Laterality Date    EYE SURGERY Left     HX APPENDECTOMY      HX OTHER      calcium deposit removed from above eye as a teen         Family Medical History:       Problem Relation (Age of Onset)    Cancer Father, Paternal Grandfather, Maternal Aunt    Healthy Mother    Multiple Sclerosis Mother            Current Outpatient Medications   Medication Sig Dispense Refill    brivaracetam (BRIVIACT) 50 mg Oral Tablet Take 1 Tablet (50 mg total) by mouth Twice daily for 30 days 60 Tablet 0    carBAMazepine (TEGRETOL XR) 200 mg Oral Tablet Sustained Release 12 hr Take 2 Tablets (400 mg total) by mouth Twice daily  360 Tablet 1     No current facility-administered medications for this visit.     Allergies   Allergen Reactions    Bee Venom Protein (Honey Bee)  Other Adverse Reaction (Add comment)     Social History     Socioeconomic History    Marital status: Married     Spouse name: Not on file    Number of children: 0    Years of education: 12    Highest education level: Not on file   Occupational History    Occupation: not employed: formerly worked at [employer] Use    Smoking status: Every Day     Packs/day: 1.00  Years: 10.00     Additional pack years: 0.00     Total pack years: 10.00     Types: Cigarettes    Smokeless tobacco: Current    Tobacco comments:     1-2 day    Vaping Use    Vaping Use: Not on file   Substance and Sexual Activity    Alcohol use: Not Currently     Comment: occas    Drug use: Not Currently     Types: IV, Marijuana     Comment: clean for age 46    Sexual activity: Not on file   Other Topics Concern    Ability to Walk 1 Flight of Steps without SOB/CP Yes    Routine Exercise Yes     Comment: strength training    Ability to Walk 2 Flight of Steps without SOB/CP Yes    Unable to Ambulate Not Asked    Total Care Not Asked    Ability To Do Own ADL's Yes    Uses Walker Not Asked    Other Activity Level Yes     Comment: mows grass , walks , can do routine stuff around the house    Uses Cane Not Asked   Social History Narrative    Right handed.      Social Determinants of Company secretary Strain: Not on file   Transportation Needs: Not on file   Social Connections: Not on file   Intimate Partner Violence: Not on file   Housing Stability: Not on file       ============================================================================================================================================  GENERAL EXAMINATION  BP (!) 144/85 (Site: Left, Patient Position: Sitting, Cuff Size: Adult)   Pulse 60   Temp 37 C (98.6 F) (Temporal)   Ht 1.854 m ('6\' 1"'$ )   Wt 80.9 kg (178 lb 6.4 oz)    SpO2 99%   BMI 23.54 kg/m     Vital signs personally reviewed  General: No acute distress, alert  HEENT: Normocephalic, no scleral icterus  Pulmonary: No accessory muscle use, no tachypnea  Cardiovascular: Heart with regular rate & rhythm  Extremities: No significant edema, No cyanosis    NEUROLOGIC EXAM  On neurological exam, patient was awake, alert and answering questions appropriately  Speech was fluent, without dysarthria or aphasia.    CN  II: not directly tested, grossly intact  III, IV, VI: extraocular movements intact without nystagmus  V: intact to light touch  VII: face symmetric without weakness  VIII: grossly intact  IX, X: symmetric palatal elevation  XI: normal strength of trapezius and sternocleidomastoid bilaterally  XII: tongue midline with full movements    MOTOR  Bulk: normal  Tone: normal  Abnormal Movements: none  Fasciculations: none    Strength:     MRC Grading Scale   Right Left   Deltoid 5 5   Biceps 5 5   Triceps 5 5   Wrist Extension 5 5   Wrist Flexion - -   Finger Extension 5 5   Finger Abduction 5 5   Finger Flexion 5 5   Hip Flexion 5 5   Hip Extension - -   Hip Abduction - -   Hip Adduction - -   Knee Extension 5 5   Knee Flexion 5 5   Ankle Dorsiflexion 5 5   Ankle Plantarflexion 5 5   Toe Extension - -   Toe Flexion - -     REFLEXES   Right Left  Biceps 2 2   Triceps 2 2   Brachioradialis 2 2   Patellar 2 2   Achilles 2 2   Plantar - -   Hoffman - -   Pectoralis - -   Jaw Jerk - -       SENSORY  Light touch: intact throughout    GAIT  General: casual, normal gait    COORDINATION  Finger nose finger: normal    ================================================================================================================================LABS  Personal Review of prior labs is notable for:   2023  CBC normal   TSH normal   CMP largely within normal limits  IMAGING  Personal Review of imaging is notable for:   MRI of the brain with and without contrast November 13, 2021 small lesion  apparent on T2 weighted imaging in the anterior right temporal lobe no abnormal enhancement      OTHER DIAGNOSTICS  Personal Review of other prior diagnostics is notable for:    Surgical pathology December 12, 2021  Final Diagnosis   A. Brain, right temporal brain tumor, resection:  - Consistent with low-grade glioneuronal tumor (see comment)     B. Brain, right temporal brain tumor, resection:  - Consistent with low-grade glioneuronal tumor (see comment)    Electronically signed by Lars Pinks, MD on 12/24/2021 at [phone]   Diagnosis Comment    The specimen shows slightly hypercellular brain parenchyma with clusters of neuronal cells. GFAP stain highlights glial cell population; mostly reactive pattern. NeuN stain shows weak reactivity in a subset of neuron-like cells, suggestive of dysplastic neurons. These abnormal neuronal cells appear to be clustered in higher density. Synaptophysin shows dot-like reactivity (synapses) but lack of cytoplasmic staining. IDH1 (R132H) is negative (wild type). CD34 highlights vessel only and lack of ramified cells. Ki67 proliferation is low, highlighting only occasional nuclei (<1%). BRAF (V600E) immunostain is negative.      Given the abnormal neuronal population and in consideration of MRI findings, this lesion is mostly consistent with a low-grade glioneuronal tumor. Negative BRAF (V600E) argues against, although not exclude, the possibility of ganglioglioma. Cortical dysplasia can not be reliably assessed in this fragmented specimen. No cystic lesion is identified microscopically.

## 2022-07-07 ENCOUNTER — Encounter (INDEPENDENT_AMBULATORY_CARE_PROVIDER_SITE_OTHER): Payer: Self-pay | Admitting: Student in an Organized Health Care Education/Training Program

## 2022-07-08 ENCOUNTER — Other Ambulatory Visit (INDEPENDENT_AMBULATORY_CARE_PROVIDER_SITE_OTHER): Payer: Self-pay | Admitting: Student in an Organized Health Care Education/Training Program

## 2022-07-13 ENCOUNTER — Ambulatory Visit (INDEPENDENT_AMBULATORY_CARE_PROVIDER_SITE_OTHER): Payer: MEDICAID | Admitting: NEUROLOGY

## 2022-07-13 ENCOUNTER — Encounter (INDEPENDENT_AMBULATORY_CARE_PROVIDER_SITE_OTHER): Payer: Self-pay | Admitting: NEUROLOGY

## 2022-07-13 ENCOUNTER — Other Ambulatory Visit: Payer: Self-pay

## 2022-07-13 VITALS — BP 138/75 | HR 78 | Temp 98.3°F | Wt 185.8 lb

## 2022-07-13 DIAGNOSIS — D496 Neoplasm of unspecified behavior of brain: Secondary | ICD-10-CM

## 2022-07-13 DIAGNOSIS — G40109 Localization-related (focal) (partial) symptomatic epilepsy and epileptic syndromes with simple partial seizures, not intractable, without status epilepticus: Secondary | ICD-10-CM

## 2022-07-13 DIAGNOSIS — G40909 Epilepsy, unspecified, not intractable, without status epilepticus: Secondary | ICD-10-CM

## 2022-07-13 MED ORDER — HYDROXYZINE PAMOATE 25 MG CAPSULE
25.0000 mg | ORAL_CAPSULE | Freq: Three times a day (TID) | ORAL | 0 refills | Status: AC | PRN
Start: 2022-07-13 — End: 2022-10-11

## 2022-07-13 MED ORDER — CARBAMAZEPINE ER 200 MG TABLET,EXTENDED RELEASE,12 HR
400.0000 mg | ORAL_TABLET | Freq: Two times a day (BID) | ORAL | 1 refills | Status: DC
Start: 2022-07-13 — End: 2022-12-11

## 2022-07-13 MED ORDER — BRIVARACETAM 50 MG TABLET
50.0000 mg | ORAL_TABLET | Freq: Two times a day (BID) | ORAL | 1 refills | Status: AC
Start: 2022-07-13 — End: 2023-01-09

## 2022-07-13 NOTE — Progress Notes (Signed)
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 Neurologic exam is nonfocal. Seizure frequency has increased since resection. Since last visit, he never picked up script for Briviact. Fortunately seizures are down in frequency since stopping Abilify.  He has had two GTC events since last visit. He is on Tegretol 400mg  BID. This is a very complicated case as he also has a history of hepatitis C. It has only partially been treated. I would like to limit further hepatically metabolized meds until he re-establishes with GI to ensure his HCV is cleared. He was previously seen in [place] but has significant transportation issues as he cannot drive due to his epilepsy. I do not think lacosamide is the best adjunct therapy as mechanisms of action are not dissimilar from Tegretol. He has been on Keppra previously and experienced mood issues. I would prefer to avoid benzodiazepine use given listed substance use history. We will try to get him approved for Briviact as this tends to have less mood effect profile than Keppra.   2.   GLIONEURONAL TUMOR: He was discovered to have a low-grade glioneuronal tumor confirmed by pathology s/p resection August 18th, 2023. It's possible that this represents a DNET given the presentation. It was felt less likely to represent a ganglioglioma given BRAF negativity. Ultimately, I think follow-up with a neuro-oncologist at some point would be preferable though there is no way for him to get to an appropriate academic center at this time. I think it's pertinent to obtain an interval MRI early (next planned scan with NSGY is August 2024) in the setting of worsening seizure activity. Included perfusion studies would be preferable, but I don't think that will be obtainable locally. I have already checked with our MR department and it is  unavailable.     PLAN  Continue Tegretol 400mg  BID        Start Briviact 50 mg BID        Obtain CMP, CBC and Tegretol level        Patient not driving, aware of [place] driving laws        Patient aware of SUDEP  2.   Obtain MRI Brain w & w/o contrast         If any recurrence, will do our best to get patient back to [facility] as well as to a neuro-oncologist     Thank you for allowing me to participate in your patient's care and please do not hesitate to contact me for any questions or concerns.    Adrian Prows, DO  Assistant Professor of Neurology  [facility]     [phone]: I will continue to be the provider focal point in managing the chronic complex neurological condition    ==========================================================================================================================================    NAME:  [name] [name]  DOB:  1990-11-15  VISIT DATE:  05/05/2022    CC:  Epilepsy    Patient seen in consultation at the request of Renard Matter, FNP  History obtained from the patient and chart/records  Age of patient:  32 y.o.    INTERVAL: Since last visit, he never tried to pick up Briviact. He discontinued Abilify and seizure frequency decreased. He has had 2 GTC events since last visit both in the month of February. Mom who joins him today, states that seizures begin with unresponsive,  staring, Then left head turn and convulsive activity. Post-ictal period is relatively short.     HPI:   I had the pleasure of seeing your patient in neurology clinic for an outpatient consultation, who is a 32 y.o. year old male who was referred for evaluation of epilepsy.  Please allow me to summarize the history for the record.    He states that seizures began at age 32 and were characterized by abrupt loss of consciousness "falling out." He was first placed on Keppra. He is not quite sure why it was stopped but thinks it was mood side effects.He has since been on  monotherapy with Tegretol.  He reports that in 2010 his seizures began to change into more frank GTC events. A right anterior temporal lobe mass was discovered recently, and in August of this year, he underwent resection in [place]. He tolerated the procedure well and post-op images appeared as expected.  Since resection, if anything seizures have worsened. He will have multiple GTCs per week. He knows there is convulsive activity, and it sounds like there is focal movement of the left side but he doesn't have a great description of the events. They are characterized by tongue biting. He is post-ictal typically until the following day. Vimpat was added to therapy by his previous neurologist Dr. Velna Hatchet, however, he discontinued this due to some discussion with a pharmacist and the fact that he felt it wasn't helping.    ============================================================================================================================================  PMHx  Patient Active Problem List   Diagnosis    Acute appendicitis    Attention deficit hyperactivity disorder (ADHD)    History of substance abuse (CMS [place])    Seizure (CMS [place])    Viral hepatitis C    Vitamin D deficiency    R temporal brain lesion s/p crani for resection     Past Surgical History:   Procedure Laterality Date    EYE SURGERY Left     HX APPENDECTOMY      HX OTHER      calcium deposit removed from above eye as a teen         Family Medical History:       Problem Relation (Age of Onset)    Cancer Father, Paternal Grandfather, Maternal Aunt    Healthy Mother    Multiple Sclerosis Mother            Current Outpatient Medications   Medication Sig Dispense Refill    brivaracetam (BRIVIACT) 50 mg Oral Tablet Take 1 Tablet (50 mg total) by mouth Twice daily for 180 days 180 Tablet 1    carBAMazepine (TEGRETOL  XR) 200 mg Oral Tablet Sustained Release 12 hr Take 2 Tablets (400 mg total) by mouth Twice daily for 180 days 360 Tablet 1    hydrOXYzine  pamoate (VISTARIL) 25 mg Oral Capsule Take 1 Capsule (25 mg total) by mouth Three times a day as needed for Anxiety for up to 90 days 270 Capsule 0     No current facility-administered medications for this visit.     Allergies   Allergen Reactions    Bee Venom Protein (Honey Bee)  Other Adverse Reaction (Add comment)     Social History     Socioeconomic History    Marital status: Married     Spouse name: Not on file    Number of children: 0    Years of education: 12    Highest education level: Not on file   Occupational History  Occupation: not employed: formerly worked at [employer]    Smoking status: Every Day     Current packs/day: 1.00     Average packs/day: 1 pack/day for 10.0 years (10.0 ttl pk-yrs)     Types: Cigarettes    Smokeless tobacco: Current    Tobacco comments:     1-2 day    Vaping Use    Vaping status: Not on file   Substance and Sexual Activity    Alcohol use: Not Currently     Comment: occas    Drug use: Not Currently     Types: IV, Marijuana     Comment: clean for age 39    Sexual activity: Not on file   Other Topics Concern    Ability to Walk 1 Flight of Steps without SOB/CP Yes    Routine Exercise Yes     Comment: strength training    Ability to Walk 2 Flight of Steps without SOB/CP Yes    Unable to Ambulate Not Asked    Total Care Not Asked    Ability To Do Own ADL's Yes    Uses Walker Not Asked    Other Activity Level Yes     Comment: mows grass , walks , can do routine stuff around the house    Uses Cane Not Asked   Social History Narrative    Right handed.      Social Determinants of Company secretary Strain: Not on file   Transportation Needs: Not on file   Social Connections: Not on file   Intimate Partner Violence: Not on file   Housing Stability: Not on file       ============================================================================================================================================  GENERAL EXAMINATION  BP 138/75 (Site: Left, Patient Position:  Sitting, Cuff Size: Adult)   Pulse 78   Temp 36.8 C (98.3 F) (Temporal)   Wt 84.3 kg (185 lb 12.8 oz)   SpO2 98%   BMI 24.51 kg/m     Vital signs personally reviewed  General: No acute distress, alert  HEENT: Normocephalic, no scleral icterus  Extremities: No significant edema, No cyanosis    NEUROLOGIC EXAM  On neurological exam, patient was awake, alert and answering questions appropriately  Speech was fluent, without dysarthria or aphasia.    CN  II-XII: grossly intact    MOTOR  Bulk: normal  Abnormal Movements: none    Strength:     MRC Grading Scale   Right Left   Deltoid 5 5   Biceps 5 5   Triceps 5 5   Wrist Extension - -   Wrist Flexion - -   Finger Extension - -   Finger Abduction - -   Finger Flexion - -   Hip Flexion 5 5   Hip Extension - -   Hip Abduction - -   Hip Adduction - -   Knee Extension 5 5   Knee Flexion 5 5   Ankle Dorsiflexion - -   Ankle Plantarflexion - -   Toe Extension - -   Toe Flexion - -     REFLEXES   Right Left   Biceps 2 2   Triceps 2 2   Brachioradialis 2 2   Patellar 2 2   Achilles 2 2   Plantar - -   Hoffman - -   Pectoralis - -   Jaw Jerk - -       SENSORY  Light touch: intact throughout  GAIT  General: casual, normal gait    ================================================================================================================================LABS  Personal Review of prior labs is notable for:   2023  CBC normal   TSH normal   CMP largely within normal limits  IMAGING  Personal Review of imaging is notable for:   MRI of the brain with and without contrast November 13, 2021 small lesion apparent on T2 weighted imaging in the anterior right temporal lobe no abnormal enhancement      OTHER DIAGNOSTICS  Personal Review of other prior diagnostics is notable for:    Surgical pathology December 12, 2021  Final Diagnosis   A. Brain, right temporal brain tumor, resection:  - Consistent with low-grade glioneuronal tumor (see comment)     B. Brain, right temporal brain tumor,  resection:  - Consistent with low-grade glioneuronal tumor (see comment)    Electronically signed by Lars Pinks, MD on 12/24/2021 at [phone]   Diagnosis Comment    The specimen shows slightly hypercellular brain parenchyma with clusters of neuronal cells. GFAP stain highlights glial cell population; mostly reactive pattern. NeuN stain shows weak reactivity in a subset of neuron-like cells, suggestive of dysplastic neurons. These abnormal neuronal cells appear to be clustered in higher density. Synaptophysin shows dot-like reactivity (synapses) but lack of cytoplasmic staining. IDH1 (R132H) is negative (wild type). CD34 highlights vessel only and lack of ramified cells. Ki67 proliferation is low, highlighting only occasional nuclei (<1%). BRAF (V600E) immunostain is negative.      Given the abnormal neuronal population and in consideration of MRI findings, this lesion is mostly consistent with a low-grade glioneuronal tumor. Negative BRAF (V600E) argues against, although not exclude, the possibility of ganglioglioma. Cortical dysplasia can not be reliably assessed in this fragmented specimen. No cystic lesion is identified microscopically.

## 2022-07-23 ENCOUNTER — Other Ambulatory Visit (INDEPENDENT_AMBULATORY_CARE_PROVIDER_SITE_OTHER): Payer: Self-pay | Admitting: NEUROLOGY

## 2022-07-23 DIAGNOSIS — G9389 Other specified disorders of brain: Secondary | ICD-10-CM

## 2022-07-30 ENCOUNTER — Telehealth (INDEPENDENT_AMBULATORY_CARE_PROVIDER_SITE_OTHER): Payer: Self-pay | Admitting: NEUROLOGY

## 2022-08-03 ENCOUNTER — Emergency Department
Admission: EM | Admit: 2022-08-03 | Discharge: 2022-08-04 | Disposition: A | Discharge status: Other Acute Inpatient Hospital | Payer: MEDICAID | Attending: Emergency Medicine | Admitting: Emergency Medicine

## 2022-08-03 ENCOUNTER — Emergency Department (HOSPITAL_BASED_OUTPATIENT_CLINIC_OR_DEPARTMENT_OTHER): Payer: MEDICAID

## 2022-08-03 ENCOUNTER — Encounter (HOSPITAL_BASED_OUTPATIENT_CLINIC_OR_DEPARTMENT_OTHER): Payer: Self-pay | Admitting: Emergency Medicine

## 2022-08-03 ENCOUNTER — Encounter (HOSPITAL_COMMUNITY): Payer: Self-pay

## 2022-08-03 ENCOUNTER — Emergency Department (EMERGENCY_DEPARTMENT_HOSPITAL): Payer: MEDICAID

## 2022-08-03 ENCOUNTER — Other Ambulatory Visit: Payer: Self-pay

## 2022-08-03 DIAGNOSIS — R4182 Altered mental status, unspecified: Secondary | ICD-10-CM | POA: Insufficient documentation

## 2022-08-03 DIAGNOSIS — R569 Unspecified convulsions: Secondary | ICD-10-CM

## 2022-08-03 DIAGNOSIS — F209 Schizophrenia, unspecified: Secondary | ICD-10-CM | POA: Insufficient documentation

## 2022-08-03 DIAGNOSIS — F419 Anxiety disorder, unspecified: Secondary | ICD-10-CM | POA: Insufficient documentation

## 2022-08-03 DIAGNOSIS — I499 Cardiac arrhythmia, unspecified: Secondary | ICD-10-CM

## 2022-08-03 DIAGNOSIS — G40409 Other generalized epilepsy and epileptic syndromes, not intractable, without status epilepticus: Secondary | ICD-10-CM | POA: Insufficient documentation

## 2022-08-03 HISTORY — DX: Neoplasm of unspecified behavior of endocrine glands and other parts of nervous system: D49.7

## 2022-08-03 HISTORY — DX: Other psychoactive substance abuse, in remission: F19.11

## 2022-08-03 HISTORY — DX: Neoplasm of unspecified behavior of brain: D49.6

## 2022-08-03 LAB — CBC WITH DIFF
BASOPHIL #: 0.03 10*3/uL (ref 0.00–0.30)
BASOPHIL %: 0 % (ref 0–3)
EOSINOPHIL #: 0.04 10*3/uL (ref 0.00–0.80)
EOSINOPHIL %: 0 % (ref 0–7)
HCT: 47.1 % (ref 42.0–51.0)
HGB: 15.9 g/dL (ref 13.5–18.0)
LYMPHOCYTE #: 0.59 10*3/uL — ABNORMAL LOW (ref 1.10–5.00)
LYMPHOCYTE %: 4 % — ABNORMAL LOW (ref 25–45)
MCH: 30.8 pg (ref 27.0–32.0)
MCHC: 33.7 g/dL (ref 32.0–36.0)
MCV: 91.4 fL (ref 78.0–99.0)
MONOCYTE #: 0.77 10*3/uL (ref 0.00–1.30)
MONOCYTE %: 5 % (ref 0–12)
MPV: 7.7 fL (ref 7.4–10.4)
NEUTROPHIL #: 13.23 10*3/uL — ABNORMAL HIGH (ref 1.80–8.40)
NEUTROPHIL %: 90 % — ABNORMAL HIGH (ref 40–76)
PLATELETS: 258 10*3/uL (ref 140–440)
RBC: 5.15 10*6/uL (ref 4.20–6.00)
RDW: 15.3 % — ABNORMAL HIGH (ref 11.6–14.8)
WBC: 14.7 10*3/uL — ABNORMAL HIGH (ref 4.0–10.5)

## 2022-08-03 LAB — URINALYSIS, MICROSCOPIC

## 2022-08-03 LAB — ECG 12 LEAD
Atrial Rate: 98 {beats}/min
Calculated P Axis: 46 degrees
Calculated R Axis: 74 degrees
Calculated T Axis: 63 degrees
PR Interval: 152 ms
QRS Duration: 92 ms
QT Interval: 340 ms
QTC Calculation: 434 ms
Ventricular rate: 98 {beats}/min

## 2022-08-03 LAB — URINALYSIS, MACRO/MICRO
BILIRUBIN: NEGATIVE mg/dL
GLUCOSE: NEGATIVE mg/dL
KETONES: NEGATIVE mg/dL
LEUKOCYTES: NEGATIVE WBCs/uL
NITRITE: NEGATIVE
PH: 6 (ref 4.6–8.0)
PROTEIN: 30 mg/dL — AB
SPECIFIC GRAVITY: >=1.03 (ref 1.003–1.035)
UROBILINOGEN: 0.2 mg/dL (ref 0.2–1.0)

## 2022-08-03 LAB — COMPREHENSIVE METABOLIC PANEL, NON-FASTING
ALBUMIN/GLOBULIN RATIO: 1.1 (ref 0.8–1.4)
ALBUMIN: 3.8 g/dL (ref 3.4–5.0)
ALKALINE PHOSPHATASE: 109 U/L (ref 46–116)
ALT (SGPT): 16 U/L (ref <=78)
ANION GAP: 14 mmol/L — ABNORMAL HIGH (ref 4–13)
AST (SGOT): 13 U/L — ABNORMAL LOW (ref 15–37)
BILIRUBIN TOTAL: 0.1 mg/dL — ABNORMAL LOW (ref 0.2–1.0)
BUN/CREA RATIO: 3
BUN: 4 mg/dL — ABNORMAL LOW (ref 7–18)
CALCIUM, CORRECTED: 8.4 mg/dL
CALCIUM: 8.2 mg/dL — ABNORMAL LOW (ref 8.5–10.1)
CHLORIDE: 105 mmol/L (ref 98–107)
CO2 TOTAL: 22 mmol/L (ref 21–32)
CREATININE: 1.24 mg/dL (ref 0.70–1.30)
ESTIMATED GFR: 79 mL/min/{1.73_m2} (ref >59)
GLOBULIN: 3.6
GLUCOSE: 77 mg/dL (ref 74–106)
OSMOLALITY, CALCULATED: 277 mOsm/kg (ref 270–290)
POTASSIUM: 4.6 mmol/L (ref 3.5–5.1)
PROTEIN TOTAL: 7.4 g/dL (ref 6.4–8.2)
SODIUM: 141 mmol/L (ref 136–145)

## 2022-08-03 LAB — DRUG SCREEN, NO CONFIRMATION, URINE
AMPHETAMINES URINE: NEGATIVE
BARBITURATES URINE: NEGATIVE
BENZODIAZEPINES URINE: POSITIVE — AB
CANNABINOIDS URINE: POSITIVE — AB
COCAINE METABOLITES URINE: NEGATIVE
METHADONE URINE: NEGATIVE
OPIATES URINE: NEGATIVE
PCP URINE: NEGATIVE

## 2022-08-03 LAB — CARBAMAZEPINE (TEGRETOL) LEVEL: CARBAMAZEPINE LEVEL: 4.5 ug/mL (ref 4.0–12.0)

## 2022-08-03 LAB — ETHANOL, SERUM/PLASMA: ETHANOL: <3 mg/dL (ref <=3)

## 2022-08-03 MED ORDER — ZIPRASIDONE 20 MG/ML (FINAL CONCENTRATION) INTRAMUSCULAR SOLUTION
20.0000 mg | Freq: Once | INTRAMUSCULAR | Status: AC
Start: 2022-08-03 — End: 2022-08-03
  Administered 2022-08-03: 20 mg via INTRAMUSCULAR

## 2022-08-03 MED ORDER — DIPHENHYDRAMINE 50 MG/ML INJECTION SOLUTION
INTRAMUSCULAR | Status: AC
Start: 2022-08-03 — End: 2022-08-03
  Filled 2022-08-03: qty 1

## 2022-08-03 MED ORDER — DIAZEPAM 5 MG/ML INJECTION SYRINGE
INJECTION | INTRAMUSCULAR | Status: AC
Start: 2022-08-03 — End: 2022-08-03
  Filled 2022-08-03: qty 2

## 2022-08-03 MED ORDER — LORAZEPAM 2 MG/ML INJECTION WRAPPER
1.0000 mg | INTRAMUSCULAR | Status: AC
Start: 2022-08-03 — End: 2022-08-03
  Administered 2022-08-03: 1 mg via INTRAVENOUS

## 2022-08-03 MED ORDER — LORAZEPAM 2 MG/ML INJECTION WRAPPER
2.0000 mg | INTRAMUSCULAR | Status: AC
Start: 2022-08-03 — End: 2022-08-03
  Administered 2022-08-03: 2 mg via INTRAMUSCULAR

## 2022-08-03 MED ORDER — LORAZEPAM 2 MG/ML INJECTION SYRINGE
INJECTION | INTRAMUSCULAR | Status: AC
Start: 2022-08-03 — End: 2022-08-03
  Filled 2022-08-03: qty 1

## 2022-08-03 MED ORDER — SODIUM CHLORIDE 0.9 % IV BOLUS
1000.0000 mL | INJECTION | Status: AC
Start: 2022-08-03 — End: 2022-08-03
  Administered 2022-08-03: 1000 mL via INTRAVENOUS
  Administered 2022-08-03: 0 mL via INTRAVENOUS

## 2022-08-03 MED ORDER — HALOPERIDOL LACTATE 5 MG/ML INJECTION SOLUTION
INTRAMUSCULAR | Status: AC
Start: 2022-08-03 — End: 2022-08-03
  Filled 2022-08-03: qty 2

## 2022-08-03 MED ORDER — SODIUM CHLORIDE 0.9 % IV BOLUS
1000.0000 mL | INJECTION | Status: AC
Start: 2022-08-03 — End: 2022-08-04
  Administered 2022-08-03: 1000 mL via INTRAVENOUS
  Administered 2022-08-04: 0 mL via INTRAVENOUS

## 2022-08-03 MED ORDER — LEVETIRACETAM 500 MG/100 ML IN SODIUM CHLORIDE (ISO-OSM) IV PIGGYBACK
INJECTION | INTRAVENOUS | Status: AC
Start: 2022-08-03 — End: 2022-08-03
  Filled 2022-08-03: qty 100

## 2022-08-03 MED ORDER — DIPHENHYDRAMINE 50 MG/ML INJECTION SOLUTION
50.0000 mg | INTRAMUSCULAR | Status: AC
Start: 2022-08-03 — End: 2022-08-03
  Administered 2022-08-03: 50 mg via INTRAVENOUS

## 2022-08-03 MED ORDER — ZIPRASIDONE 20 MG/ML (FINAL CONCENTRATION) INTRAMUSCULAR SOLUTION
INTRAMUSCULAR | Status: AC
Start: 2022-08-03 — End: 2022-08-03
  Filled 2022-08-03: qty 1

## 2022-08-03 MED ORDER — HALOPERIDOL LACTATE 5 MG/ML INJECTION SOLUTION
10.0000 mg | INTRAMUSCULAR | Status: AC
Start: 2022-08-03 — End: 2022-08-03
  Administered 2022-08-03: 10 mg via INTRAVENOUS

## 2022-08-03 MED ORDER — LORAZEPAM 2 MG/ML INJECTION WRAPPER
2.0000 mg | INTRAMUSCULAR | Status: AC
Start: 2022-08-03 — End: 2022-08-03
  Administered 2022-08-03: 2 mg via INTRAVENOUS

## 2022-08-03 MED ORDER — SODIUM CHLORIDE 0.9 % INTRAVENOUS SOLUTION
1000.0000 mg | INTRAVENOUS | Status: AC
Start: 2022-08-03 — End: 2022-08-03
  Administered 2022-08-03: 0 mg via INTRAVENOUS
  Administered 2022-08-03: 1000 mg via INTRAVENOUS

## 2022-08-03 MED ORDER — LEVETIRACETAM 500 MG/5 ML INTRAVENOUS SOLUTION
INTRAVENOUS | Status: AC
Start: 2022-08-03 — End: 2022-08-03
  Filled 2022-08-03: qty 5

## 2022-08-03 MED ORDER — DIAZEPAM 5 MG/ML INJECTION SYRINGE
5.0000 mg | INJECTION | INTRAMUSCULAR | Status: AC
Start: 2022-08-03 — End: 2022-08-03
  Administered 2022-08-03: 5 mg via INTRAVENOUS

## 2022-08-03 NOTE — ED Attending Note (Signed)
Patient care was discussed with [facility] Shawn[facility] Dapper neurology.  Patient accepted for transfer placed on wait list.  Patient resting in no obvious distress.

## 2022-08-03 NOTE — ED Nurses Note (Signed)
Resting quietly even unlabored respiration family at bedside

## 2022-08-03 NOTE — ED Nurses Note (Signed)
Trying to get OOB trying to pull at IV family at bedside unable to help control patient  trying to redirect patient to stay in bed  confused will not follow directions  soft wrist restaints intact  still putting legs thru  bed rails    medicated with geodon

## 2022-08-03 NOTE — ED Notes (Signed)
Placement attempts made to    [facility]- no neuro  [facility]- higher level  [facility]- wait list

## 2022-08-03 NOTE — ED Provider Notes (Signed)
[facility]  ED Primary Provider Note  History of Present Illness   Chief Complaint   Patient presents with   . Seizure Prior Hx Of     [name] [name] is a 32 y.o. male who had concerns including Seizure Prior Hx Of.  Arrival: The patient arrived by Ambulance complaining of having 5 seizures this morning.  Patient had 3 seizures prior to EMS arriving at the house.  He then had 2 more with EMS lasting less than a minute each.  Patient presently is postictal snoring respirations.  Unclear whether the patient has been taking his medication or not.  Patient is supposed to be on Tegretol as well as Briviact.  Patient does have a history seizures in the past.  Patient also has a history of anxiety with attention deficit disorder as well as hepatitis-C.  Patient does smoke daily.  No further history is obtainable from the patient.  Family stated that he does have a history of schizophrenia as well.  Patient has had seizures in March and then again April 2nd had another seizure.  Patient was then started on Briviact as a secondary antiseizure med.    HPI  Review of Systems   Review of Systems   Constitutional:  Positive for activity change and appetite change. Negative for chills and fever.   HENT:  Negative for ear pain and sore throat.    Eyes:  Negative for pain and visual disturbance.   Respiratory:  Negative for cough and shortness of breath.    Cardiovascular:  Negative for chest pain and palpitations.   Gastrointestinal:  Negative for abdominal pain and vomiting.   Genitourinary:  Negative for dysuria and hematuria.   Musculoskeletal:  Negative for arthralgias and back pain.   Skin:  Negative for color change and rash.   Neurological:  Positive for seizures. Negative for syncope.   All other systems reviewed and are negative.     Historical Data   History Reviewed This Encounter: Medical History  Surgical History  Family History  Social  History    Physical Exam   ED Triage Vitals [08/03/22 1337]   BP (Non-Invasive) (!) 147/85   Heart Rate (!) 105   Respiratory Rate (!) 27   Temperature 36.9 C (98.4 F)   SpO2 97 %   Weight 84.3 kg (185 lb 12.8 oz)   Height 1.778 m (5\' 10" )     Physical Exam  Vitals and nursing note reviewed.   Constitutional:       General: He is not in acute distress.     Appearance: He is well-developed and normal weight.   HENT:      Head: Normocephalic and atraumatic.      Right Ear: External ear normal.      Left Ear: External ear normal.      Nose: Nose normal.      Mouth/Throat:      Mouth: Mucous membranes are dry.   Eyes:      Extraocular Movements: Extraocular movements intact.      Conjunctiva/sclera: Conjunctivae normal.      Pupils: Pupils are equal, round, and reactive to light.   Cardiovascular:      Rate and Rhythm: Normal rate and regular rhythm.      Pulses: Normal pulses.      Heart sounds: Normal heart sounds. No murmur heard.  Pulmonary:      Effort: Pulmonary effort is normal. No respiratory distress.  Breath sounds: Normal breath sounds.   Abdominal:      General: Bowel sounds are normal.      Palpations: Abdomen is soft.      Tenderness: There is no abdominal tenderness.   Musculoskeletal:         General: No swelling. Normal range of motion.      Cervical back: Normal range of motion and neck supple.   Skin:     General: Skin is warm and dry.      Capillary Refill: Capillary refill takes less than 2 seconds.   Neurological:      General: No focal deficit present.      Mental Status: He is alert. He is disoriented.   Psychiatric:         Mood and Affect: Mood normal.      Comments: Patient is presently postictal and unable to answer any questions.  Patient is easily arousable with verbal as well as painful stimuli.       Patient Data     Labs Ordered/Reviewed   COMPREHENSIVE METABOLIC PANEL, NON-FASTING - Abnormal; Notable for the following components:       Result Value    ANION GAP 14 (*)     BUN 4  (*)     CALCIUM 8.2 (*)     AST (SGOT) 13 (*)     BILIRUBIN TOTAL 0.1 (*)     All other components within normal limits    Narrative:     Estimated Glomerular Filtration Rate (eGFR) is calculated using the CKD-EPI (2021) equation, intended for patients 41 years of age and older. If gender is not documented or "unknown", there will be no eGFR calculation.   DRUG SCREEN, NO CONFIRMATION, URINE - Abnormal; Notable for the following components:    BENZODIAZEPINES URINE Positive (*)     CANNABINOIDS URINE Positive (*)     All other components within normal limits   CBC WITH DIFF - Abnormal; Notable for the following components:    WBC 14.7 (*)     RDW 15.3 (*)     NEUTROPHIL % 90 (*)     LYMPHOCYTE % 4 (*)     NEUTROPHIL # 13.23 (*)     LYMPHOCYTE # 0.59 (*)     All other components within normal limits   URINALYSIS, MACRO/MICRO - Abnormal; Notable for the following components:    COLOR Light Yellow (*)     APPEARANCE Slightly Hazy (*)     PROTEIN 30 (*)     BLOOD Moderate (*)     All other components within normal limits   URINALYSIS, MICROSCOPIC - Abnormal; Notable for the following components:    RBCS 3-5 (*)     BACTERIA Few (*)     AMORPHOUS SEDIMENT Many (*)     WBCS 11-15 (*)     All other components within normal limits   ETHANOL, SERUM/PLASMA - Normal   TEGRETOL LEVEL - Normal   CBC/DIFF    Narrative:     The following orders were created for panel order CBC/DIFF.  Procedure                               Abnormality         Status                     ---------                               -----------         ------  CBC WITH HYWV[[id number]]                Abnormal            Final result                 Please view results for these tests on the individual orders.   URINALYSIS WITH REFLEX MICROSCOPIC AND CULTURE IF POSITIVE    Narrative:     The following orders were created for panel order URINALYSIS WITH REFLEX MICROSCOPIC AND CULTURE IF POSITIVE.  Procedure                                Abnormality         Status                     ---------                               -----------         ------                     URINALYSIS, MACRO/MICRO[[id number]]      Abnormal            Final result               URINALYSIS, MICROSCOPIC[[id number]]      Abnormal            Final result                 Please view results for these tests on the individual orders.   PERFORM POC WHOLE BLOOD GLUCOSE     CT BRAIN WO IV CONTRAST   Final Result by Edi, Radresults In (04/08 1705)   1. As visualized, no acute intracranial abnormality.   2. Postsurgical changes about the right temporal lobe. Short-term follow-up MRI without and with IV contrast could be considered for further evaluation if needed.         One or more dose reduction techniques were used (e.g., Automated exposure control, adjustment of the mA and/or kV according to patient size, use of iterative reconstruction technique).         Radiologist location ID: [id number]         XR AP MOBILE CHEST   Final Result by Edi, Radresults In (04/08 1405)   NO ACUTE FINDINGS.            Radiologist location ID: [id number]           Medical Decision Making        Medical Decision Making  Patient is 32 year old white male brought in by EMS after having 3 seizures home.  Patient has had 2 more lasting less than a minute each in route.  Patient was given Versed which abated the last seizure.  Patient is postictal presently with snoring respirations.  Unknown whether the patient is taking his medication or not.  Patient does have a history of seizures in the past as well as hep C.  Unknown whether the patient did alcohol or drugs.  Patient will be tested for bolus.  Patient will have an IV placed for hydration.  Patient will be loaded with Keppra 1 g IV.  Patient will also have labs and chest x-ray.  Patient will have a CT of his head make sure  there was nothing causing seizures.  Depending on results will depend on whether the patient is able to go home afterwards once  he regains full consciousness.        Family and patient are adamant about not going to [facility].  They said they will sign out AMA if sent there.  They want to be admitted somewhere else.  Patient is still not acting appropriate.  Patient is very combat of and screaming and yelling at everyone.  Patient was given Geodon to help control his anger.  Patient was also given Ativan 2 mg.  Patient is now sleeping.  Patient has had no further seizures since 1st coming.      Critical Care Time:  Total critical care time spent in direct care of this patient at high risk based on presenting history/exam/and complaint, including initial evaluation and stabilization, review of data, re-examination, discussion with admitting and consulting services to arrange definitive care, and exclusive of any procedures performed, was 45 minutes     Care of the patient was handed off to Dr. Jaedan Schuman at 7:30 p.m.Marland Kitchen  Please refer to their ED course note for further details on patient's ED course, further imaging, ultimately patient disposition.    Amount and/or Complexity of Data Reviewed  Labs: ordered.  Radiology: ordered.  ECG/medicine tests: ordered.     Details: Normal sinus rhythm 98, PR interval 152 MS, QT interval 340 MS, otherwise normal EKG.    Risk  Prescription drug management.  Parenteral controlled substances.  Decision regarding hospitalization.      ED Course as of 08/03/22 2023   Mon Aug 03, 2022   2023 Patient care was discussed with  [facility] hospitalist who recommended patient be transferred to higher level of care.  We have consulted [facility] where patient's previous tumor craniotomy resection was done         Medications Administered in the ED   NS bolus infusion 1,000 mL (0 mL Intravenous Stopped 08/03/22 1500)   levETIRAcetam (KEPPRA) 1,000 mg in NS 100 mL IVPB (0 mg Intravenous Stopped 08/03/22 1415)   LORazepam (ATIVAN) 2 mg/mL injection (1 mg Intravenous Given 08/03/22 1500)   LORazepam (ATIVAN) 2 mg/mL injection  (2 mg Intravenous Given 08/03/22 1530)   ziprasidone (GEODON) IM injection (20 mg IntraMUSCULAR Given 08/03/22 1550)   haloperidol (HALDOL) 5 mg/mL injection (10 mg Intravenous Given 08/03/22 1900)   LORazepam (ATIVAN) 2 mg/mL injection (2 mg IntraMUSCULAR Given 08/03/22 1900)   diphenhydrAMINE (BENADRYL) 50 mg/mL injection (50 mg Intravenous Given 08/03/22 1900)     Clinical Impression   Altered mental status, unspecified altered mental status type (Primary)   Grand mal seizure (CMS [place])       Disposition: Transfered to Another Facility               Clinical Impression   Altered mental status, unspecified altered mental status type (Primary)   Grand mal seizure (CMS [facility])       Current Discharge Medication List

## 2022-08-03 NOTE — ED Attending Note (Signed)
Patient became agitated combative required IV Valium to calm him down.  Again he was aggressive on awakening pulling at IVs and attempting to hit the nursing staff

## 2022-08-03 NOTE — ED Nurses Note (Signed)
Awake confused aggressive pulling at lines trying to get OOB kicking  wanting to get up mother at bedside unable to control or will not follow commands  soft wrist restrains reapplied   Dr Ermalinda Memos at bedside   medications given

## 2022-08-03 NOTE — ED Nurses Note (Signed)
Pt resting on stretcher, respirations even and unlabored. No s/s of distress at this time. Plan of care ongoing. Soft wrist restraints in place. Mother at bedside.

## 2022-08-03 NOTE — ED Attending Note (Signed)
Patient was transferred to my service 7:30 p.m. presently resting comfortably he has not agitated or combative.  Patient had 5 seizures earlier today treated with IV Keppra has also received Ativan.  Attempting to transfer patient to higher level of care.

## 2022-08-03 NOTE — ED Nurses Note (Signed)
Sedated not attempting to pull or get OOB called CT stated now would be a good time to do CT scan

## 2022-08-03 NOTE — ED Nurses Note (Signed)
Pt attempting to get out of bed, unable to reorient pt. Noted increased agitation and confusion. Provider notified, see MAR. Pt incontinent of urine and cleaned up and placed in dry brief. Soft wrist restraints in place. Family at bedside and this nurse explained importance of pt is not able to get up of bed due to being a fall risk to pt's mother. Mother acknowledges risk and remains at bedside.

## 2022-08-03 NOTE — ED Nurses Note (Signed)
Family concerned about swelling of Left eye and hands   can not see any swelling of hands  wrist restraints loosen able to get 2 fingers under restraint all around the wrist  hands warm to touch

## 2022-08-03 NOTE — ED Nurses Note (Signed)
To xray to do CT of head  mother stated he had head surgery in August for a "lesion"  had previous seizures  march 22 and again April 2  family at bedside

## 2022-08-03 NOTE — ED Triage Notes (Signed)
EMS states the family told her the pt may have had 4 seizures today, did have 2 seizures witnessed by EMS. Was given versed 4mg  IM , 18 gauge left forearm,. Upon arrival to the ER pt has snoring respirations.

## 2022-08-04 ENCOUNTER — Inpatient Hospital Stay (HOSPITAL_COMMUNITY): Payer: MEDICAID

## 2022-08-04 ENCOUNTER — Inpatient Hospital Stay
Admission: AD | Admit: 2022-08-04 | Discharge: 2022-08-05 | DRG: 101 | Disposition: A | Discharge status: Home / Self Care | Payer: MEDICAID | Source: Other Acute Inpatient Hospital | Attending: Neurology | Admitting: Neurology

## 2022-08-04 DIAGNOSIS — F1910 Other psychoactive substance abuse, uncomplicated: Secondary | ICD-10-CM | POA: Diagnosis present

## 2022-08-04 DIAGNOSIS — Z85841 Personal history of malignant neoplasm of brain: Secondary | ICD-10-CM

## 2022-08-04 DIAGNOSIS — R451 Restlessness and agitation: Secondary | ICD-10-CM

## 2022-08-04 DIAGNOSIS — G40909 Epilepsy, unspecified, not intractable, without status epilepticus: Principal | ICD-10-CM | POA: Diagnosis present

## 2022-08-04 DIAGNOSIS — D496 Neoplasm of unspecified behavior of brain: Secondary | ICD-10-CM

## 2022-08-04 DIAGNOSIS — G4089 Other seizures: Secondary | ICD-10-CM

## 2022-08-04 DIAGNOSIS — Z91030 Bee allergy status: Secondary | ICD-10-CM

## 2022-08-04 DIAGNOSIS — F209 Schizophrenia, unspecified: Secondary | ICD-10-CM | POA: Diagnosis present

## 2022-08-04 DIAGNOSIS — Z9049 Acquired absence of other specified parts of digestive tract: Secondary | ICD-10-CM

## 2022-08-04 DIAGNOSIS — Z1383 Encounter for screening for respiratory disorder NEC: Secondary | ICD-10-CM

## 2022-08-04 DIAGNOSIS — C712 Malignant neoplasm of temporal lobe: Secondary | ICD-10-CM

## 2022-08-04 DIAGNOSIS — Z79899 Other long term (current) drug therapy: Secondary | ICD-10-CM

## 2022-08-04 DIAGNOSIS — Z781 Physical restraint status: Secondary | ICD-10-CM

## 2022-08-04 DIAGNOSIS — G40919 Epilepsy, unspecified, intractable, without status epilepticus: Secondary | ICD-10-CM

## 2022-08-04 DIAGNOSIS — B1920 Unspecified viral hepatitis C without hepatic coma: Secondary | ICD-10-CM | POA: Diagnosis present

## 2022-08-04 DIAGNOSIS — R9401 Abnormal electroencephalogram [EEG]: Secondary | ICD-10-CM

## 2022-08-04 DIAGNOSIS — R410 Disorientation, unspecified: Secondary | ICD-10-CM

## 2022-08-04 DIAGNOSIS — F909 Attention-deficit hyperactivity disorder, unspecified type: Secondary | ICD-10-CM | POA: Diagnosis present

## 2022-08-04 DIAGNOSIS — F17210 Nicotine dependence, cigarettes, uncomplicated: Secondary | ICD-10-CM | POA: Diagnosis present

## 2022-08-04 DIAGNOSIS — Z91148 Patient's other noncompliance with medication regimen for other reason: Secondary | ICD-10-CM

## 2022-08-04 LAB — CBC WITH DIFF
BASOPHIL #: <0.1 10*3/uL (ref <=0.20)
BASOPHIL %: 0.7 %
EOSINOPHIL #: <0.1 10*3/uL (ref <=0.50)
EOSINOPHIL %: 0.3 %
HCT: 43.8 % (ref 38.9–52.0)
HGB: 14.9 g/dL (ref 13.4–17.5)
IMMATURE GRANULOCYTE #: <0.1 10*3/uL (ref <0.10)
IMMATURE GRANULOCYTE %: 0.6 % (ref 0.0–1.0)
LYMPHOCYTE #: 1.66 10*3/uL (ref 1.00–4.80)
LYMPHOCYTE %: 16.2 %
MCH: 30.5 pg (ref 26.0–32.0)
MCHC: 34 g/dL (ref 31.0–35.5)
MCV: 89.8 fL (ref 78.0–100.0)
MONOCYTE #: 0.9 10*3/uL (ref 0.20–1.10)
MONOCYTE %: 8.8 %
MPV: 9.9 fL (ref 8.7–12.5)
NEUTROPHIL #: 7.5 10*3/uL (ref 1.50–7.70)
NEUTROPHIL %: 73.4 %
PLATELETS: 234 10*3/uL (ref 150–400)
RBC: 4.88 10*6/uL (ref 4.50–6.10)
RDW-CV: 12.5 % (ref 11.5–15.5)
WBC: 10.2 10*3/uL (ref 3.7–11.0)

## 2022-08-04 LAB — BASIC METABOLIC PANEL
ANION GAP: 9 mmol/L (ref 4–13)
BUN/CREA RATIO: 7 (ref 6–22)
BUN: 11 mg/dL (ref 8–25)
CALCIUM: 9 mg/dL (ref 8.6–10.2)
CHLORIDE: 109 mmol/L (ref 96–111)
CO2 TOTAL: 20 mmol/L — ABNORMAL LOW (ref 22–30)
CREATININE: 1.55 mg/dL — ABNORMAL HIGH (ref 0.75–1.35)
ESTIMATED GFR - MALE: 60 mL/min/BSA (ref >=60)
GLUCOSE: 75 mg/dL (ref 65–125)
POTASSIUM: 4 mmol/L (ref 3.5–5.1)
SODIUM: 138 mmol/L (ref 136–145)

## 2022-08-04 LAB — URINALYSIS, MACROSCOPIC
BILIRUBIN: NEGATIVE mg/dL
COLOR: NORMAL
GLUCOSE: NEGATIVE mg/dL
KETONES: NEGATIVE mg/dL
LEUKOCYTES: NEGATIVE WBCs/uL
NITRITE: NEGATIVE
PH: 5 (ref 5.0–8.0)
PROTEIN: NEGATIVE mg/dL
SPECIFIC GRAVITY: 1.009 (ref 1.005–1.030)
UROBILINOGEN: NEGATIVE mg/dL

## 2022-08-04 LAB — HEPATIC FUNCTION PANEL
ALBUMIN: 3.8 g/dL (ref 3.5–5.0)
ALKALINE PHOSPHATASE: 117 U/L — ABNORMAL HIGH (ref 45–115)
ALT (SGPT): 13 U/L (ref 10–55)
AST (SGOT): 26 U/L (ref 8–45)
BILIRUBIN DIRECT: 0.1 mg/dL (ref 0.1–0.4)
BILIRUBIN TOTAL: 0.3 mg/dL (ref 0.3–1.3)
PROTEIN TOTAL: 7.1 g/dL (ref 6.4–8.3)

## 2022-08-04 LAB — URINALYSIS, MICROSCOPIC
HYALINE CASTS: 1 /lpf (ref <4.0)
RBCS: 5 /hpf (ref <6.0)
WBCS: 4 /hpf — ABNORMAL HIGH (ref <4.0)

## 2022-08-04 LAB — MAGNESIUM: MAGNESIUM: 2.5 mg/dL (ref 1.8–2.6)

## 2022-08-04 LAB — PHOSPHORUS: PHOSPHORUS: 3.5 mg/dL (ref 2.4–4.7)

## 2022-08-04 MED ORDER — HALOPERIDOL LACTATE 5 MG/ML INJECTION SOLUTION
INTRAMUSCULAR | Status: AC
Start: 2022-08-04 — End: 2022-08-04
  Filled 2022-08-04: qty 1

## 2022-08-04 MED ORDER — DIPHENHYDRAMINE 50 MG/ML INJECTION SOLUTION
50.0000 mg | INTRAMUSCULAR | Status: AC
Start: 2022-08-04 — End: 2022-08-04
  Administered 2022-08-04: 50 mg via INTRAVENOUS

## 2022-08-04 MED ORDER — DIAZEPAM 5 MG/ML INJECTION SYRINGE
INJECTION | INTRAMUSCULAR | Status: AC
Start: 2022-08-04 — End: 2022-08-04
  Filled 2022-08-04: qty 2

## 2022-08-04 MED ORDER — BRIVARACETAM 10 MG/ML ORAL SOLUTION
50.0000 mg | Freq: Two times a day (BID) | ORAL | Status: DC
Start: 2022-08-04 — End: 2022-08-05
  Administered 2022-08-04 (×2): 50 mg via ORAL
  Filled 2022-08-04 (×2): qty 5

## 2022-08-04 MED ORDER — ACETAMINOPHEN 325 MG TABLET
650.0000 mg | ORAL_TABLET | ORAL | Status: DC | PRN
Start: 2022-08-04 — End: 2022-08-05
  Administered 2022-08-04: 650 mg via ORAL
  Filled 2022-08-04: qty 2

## 2022-08-04 MED ORDER — DIPHENHYDRAMINE 50 MG/ML INJECTION SOLUTION
INTRAMUSCULAR | Status: AC
Start: 2022-08-04 — End: 2022-08-04
  Filled 2022-08-04: qty 1

## 2022-08-04 MED ORDER — ENOXAPARIN 40 MG/0.4 ML SUBCUTANEOUS SYRINGE
40.0000 mg | INJECTION | SUBCUTANEOUS | Status: DC
Start: 2022-08-04 — End: 2022-08-05
  Administered 2022-08-04 – 2022-08-05 (×2): 0 mg via SUBCUTANEOUS
  Filled 2022-08-04: qty 0.4

## 2022-08-04 MED ORDER — CARBAMAZEPINE ER 200 MG CAPSULE,EXTENDED RELEASE MPHASE12HR
400.0000 mg | ORAL_CAPSULE | Freq: Two times a day (BID) | ORAL | Status: DC
Start: 2022-08-04 — End: 2022-08-05
  Administered 2022-08-04 – 2022-08-05 (×3): 400 mg via ORAL
  Filled 2022-08-04 (×4): qty 2

## 2022-08-04 MED ORDER — DIAZEPAM 5 MG/ML INJECTION SYRINGE
5.0000 mg | INJECTION | INTRAMUSCULAR | Status: AC
Start: 2022-08-04 — End: 2022-08-04
  Administered 2022-08-04: 5 mg via INTRAVENOUS

## 2022-08-04 MED ORDER — HALOPERIDOL LACTATE 5 MG/ML INJECTION SOLUTION
5.0000 mg | INTRAMUSCULAR | Status: AC
Start: 2022-08-04 — End: 2022-08-04
  Administered 2022-08-04: 5 mg via INTRAVENOUS

## 2022-08-04 MED ORDER — HYDROXYZINE PAMOATE 25 MG CAPSULE
25.0000 mg | ORAL_CAPSULE | Freq: Three times a day (TID) | ORAL | Status: DC | PRN
Start: 2022-08-04 — End: 2022-08-05
  Administered 2022-08-04: 25 mg via ORAL
  Filled 2022-08-04: qty 1

## 2022-08-04 MED ORDER — SENNOSIDES 8.6 MG-DOCUSATE SODIUM 50 MG TABLET
1.0000 | ORAL_TABLET | Freq: Two times a day (BID) | ORAL | Status: DC | PRN
Start: 2022-08-04 — End: 2022-08-05

## 2022-08-04 MED ORDER — SODIUM CHLORIDE 0.9 % INTRAVENOUS SOLUTION
INTRAVENOUS | Status: AC
Start: 2022-08-04 — End: ?
  Administered 2022-08-05: 0 mL via INTRAVENOUS

## 2022-08-04 NOTE — ED Nurses Note (Signed)
Mother notified of pt's transfer.

## 2022-08-04 NOTE — ED Notes (Addendum)
[facility] called back with bed 10 E room 1075  Report [phone]

## 2022-08-04 NOTE — H&P (Signed)
[facility]   Neurology H&P     Tayvien, Kane, 32 y.o. 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 follows with Dr. Sedalia [facility]a [facility] in [place]. Worsening seizure frequency post resection. Patient had multiple seizures on 08/03/22 per chart review having 5 seizures through out the day. Patient had 3 seizures prior to EMS arriving at his house in the morning. He then had 2 more with EMS lasting less than a minute each. Unclear if he ever started Briviac at 07/13/22. In the outside facility patient had CT brain 08/03/22: unremarkable. The patient was then transported to [facility] due to preference by patient's family. Patient in [place] got loaded with 1 g IV Keppra, and 5 mg of Ativan some for seizure and possible agitation, for which he also received Ziprasidone 20 mg and Haloperidol 15 mg. On his way to [facility] he also received 10 mg IV Valium and Benadryl 100 mg for same agitation. On arrival to [facility] he is somnolent.    Of note:  Patient last seizure previous to this one was on April/2/24.     Pertinent studies:  - MRI brain w/wo: 11/13/21: T2 hyperintense lesion measuring about 12 mm in greatest dimension in the anterior right temporal lobe found to be a Low grade glioma     Semiology     Pre-spell - unclear  Spell-   GTC events, amnestic to events, eyes roll back, bite tongue, deep breath,raspy breathing. Flails limbs, stiffening of body   Altered Consciousness yes, unclear bladder incontinence   Post-spell-  Typically until the following day       (known history)  Onset: Onset  of seizure on 2021   Course of epilepsy : Unclear   Current frequency- He will have multiple GTCs per week.     Semiology/Spell types  -Spell 1- GTC events, amnestic to events, eyes roll back, bite tongue, deep breath,raspy breathing. Flails limbs, stiffening of body   Altered Consciousness yes, unclear bladder incontinence    Triggers - unknown     Current antiepileptics: Tegretol 400mg  BID and Briviact 50 mg BID  Previous/Failed antiepileptics: Keppra previously and experienced mood issues. Vimpat unsure dosage stopped because was not helpful      Admission Source:  Transfer from another hospital -  [facility]  Advance Directives:  None-Discussed  Hospice involvement prior to admission?  Not applicable    Location (of pain): Quality (character of pain) Severity (minimal, mild, severe, scale or 1-10) Duration (how long has pain/sx present) Timing (when does pain/sx occur)  Context (activity at/before onset) Modifying Factors (what makes pain/sx  Better/worse) Associate Sign/Sx (what accompanies main pain/sx)    Past Medical History:   Diagnosis Date    Anxiety     Attention deficit hyperactivity disorder (ADHD) 09/17/2021    Bradycardia     Brain tumor (CMS HCC)     removed 11/2021    Depression     Glioneuronal tumor     Headache     Hepatitis C     cured  Hx of substance abuse (CMS HCC)     Seizure (CMS HCC)     Smoker          Past Surgical History:   Procedure Laterality Date    BRAIN SURGERY Right     EYE SURGERY Left     HX APPENDECTOMY      HX OTHER      calcium deposit removed from above eye as a teen         Medications Prior to Admission       Prescriptions    brivaracetam (BRIVIACT) 50 mg Oral Tablet    Take 1 Tablet (50 mg total) by mouth Twice daily for 180 days    carBAMazepine (TEGRETOL  XR) 200 mg Oral Tablet Sustained Release 12 hr    Take 2 Tablets (400 mg total) by mouth Twice daily for 180 days    hydrOXYzine pamoate (VISTARIL) 25 mg Oral Capsule    Take 1 Capsule (25 mg total) by mouth Three  times a day as needed for Anxiety for up to 90 days          Allergies   Allergen Reactions    Bee Venom Protein (Honey Bee)  Other Adverse Reaction (Add comment)     Social History     Tobacco Use    Smoking status: Every Day     Current packs/day: 1.00     Average packs/day: 1 pack/day for 10.0 years (10.0 ttl pk-yrs)     Types: Cigarettes    Smokeless tobacco: Current    Tobacco comments:     1-2 day    Substance Use Topics    Alcohol use: Not Currently     Comment: occas     Past Family History:   Family Medical History:       Problem Relation (Age of Onset)    Cancer Father, Paternal Grandfather, Maternal Aunt    Healthy Mother    Multiple Sclerosis Mother              ROS: Other than ROS in the HPI, all other systems were negative.    Exam:  Temperature: 36.8 C (98.3 F)  Heart Rate: 65  BP (Non-Invasive): 127/87  Respiratory Rate: 14  SpO2: 95 %  General: appears chronically ill  HENT:Head atraumatic and normocephalic  Glasgow: Eye opening:  2 to pain, Verbal response:  2 incomprehensible sounds, Best motor response:  5 moved to localized pain  Mental status:  Level of Consciousness: drowsy  Orientations: Unable to respond very sedated.  AttentionsAttention decreased and Concentration decreased  Knowledge: Unable to respond very sedated.  Language: No verbal output just groans and moans.  Speech: meaningless sounds   Cranial nerves:   CN2: Patient Blinks to threat  CN 3,4,6: Pupils are equal and reactive to light with corneal are present.  CN 5: grossly normal   CN 7: Symmetrical when grimace to pain    CN 8: Unable to respond very sedated.  CN 9,10: Did not gag patient, but uvula looked midline  CN 11: Unable to assess  CN 12: Unable to assess  Gait, Coordination, and Reflexes:   Gait: Normal and unable to assess. Reason: sedation  Coordination: Unable to assess    Muscle tone: WNL  Muscle exam: Patient localizes in all four extremities, with all four extremities being anti gravity.    Reflexes   RJ BJ TJ  KJ AJ Plantars Hoffman's   Right 2+ 2+ 2+ 1+ 1+ Downgoing  Not present   Left 2+ 2+ 2+ 1+ 1+ Downgoing Not present       Labs:    Lab Results Today:    Results for orders placed or performed during the hospital encounter of 08/04/22 (from the past 24 hour(s))   URINALYSIS, MACROSCOPIC   Result Value Ref Range    SPECIFIC GRAVITY 1.009 1.005 - 1.030    GLUCOSE Negative Negative mg/dL    PROTEIN Negative Negative mg/dL    BILIRUBIN Negative Negative mg/dL    UROBILINOGEN Negative Negative mg/dL    PH 5.0 5.0 - 8.0    BLOOD Small (A) Negative mg/dL    KETONES Negative Negative mg/dL    NITRITE Negative Negative    LEUKOCYTES Negative Negative WBCs/uL    APPEARANCE Clear Clear    COLOR Normal (Yellow) Normal (Yellow)   URINALYSIS, MICROSCOPIC   Result Value Ref Range    WBCS 4.0 (H) <4.0 /hpf    RBCS 5.0 <6.0 /hpf    BACTERIA Occasional or less Occasional or less /hpf    HYALINE CASTS 1.0 <4.0 /lpf    MUCOUS Light Light /lpf   BASIC METABOLIC PANEL   Result Value Ref Range    SODIUM 138 136 - 145 mmol/L    POTASSIUM 4.0 3.5 - 5.1 mmol/L    CHLORIDE 109 96 - 111 mmol/L    CO2 TOTAL 20 (L) 22 - 30 mmol/L    ANION GAP 9 4 - 13 mmol/L    CALCIUM 9.0 8.6 - 10.2 mg/dL    GLUCOSE 75 65 - 132 mg/dL    BUN 11 8 - 25 mg/dL    CREATININE 4.40 (H) 0.75 - 1.35 mg/dL    BUN/CREA RATIO 7 6 - 22    ESTIMATED GFR - MALE 60 >=60 mL/min/BSA   MAGNESIUM   Result Value Ref Range    MAGNESIUM 2.5 1.8 - 2.6 mg/dL   PHOSPHORUS   Result Value Ref Range    PHOSPHORUS 3.5 2.4 - 4.7 mg/dL   HEPATIC FUNCTION PANEL   Result Value Ref Range    ALBUMIN 3.8 3.5 - 5.0 g/dL     ALKALINE PHOSPHATASE 117 (H) 45 - 115 U/L    ALT (SGPT) 13 10 - 55 U/L    AST (SGOT)  26 8 - 45 U/L    BILIRUBIN TOTAL 0.3 0.3 - 1.3 mg/dL    BILIRUBIN DIRECT 0.1 0.1 - 0.4 mg/dL    PROTEIN TOTAL 7.1 6.4 - 8.3 g/dL   CBC WITH DIFF   Result Value Ref Range    WBC 10.2 3.7 - 11.0 x10^3/uL    RBC 4.88 4.50 - 6.10 x10^6/uL    HGB 14.9 13.4 - 17.5 g/dL    HCT 10.2 72.5 - 36.6 %    MCV  89.8 78.0 - 100.0 fL    MCH 30.5 26.0 - 32.0 pg    MCHC 34.0 31.0 - 35.5 g/dL    RDW-CV 44.0 34.7 - 42.5 %    PLATELETS 234 150 - 400 x10^3/uL    MPV 9.9 8.7 - 12.5 fL    NEUTROPHIL % 73.4 %    LYMPHOCYTE % 16.2 %    MONOCYTE % 8.8 %    EOSINOPHIL % 0.3 %    BASOPHIL % 0.7 %    NEUTROPHIL # 7.50 1.50 - 7.70 x10^3/uL    LYMPHOCYTE # 1.66 1.00 - 4.80 x10^3/uL    MONOCYTE # 0.90 0.20 - 1.10 x10^3/uL    EOSINOPHIL # <  0.10 <=0.50 x10^3/uL    BASOPHIL # <0.10 <=0.20 x10^3/uL    IMMATURE GRANULOCYTE % 0.6 0.0 - 1.0 %    IMMATURE GRANULOCYTE # <0.10 <0.10 x10^3/uL   Results for orders placed or performed during the hospital encounter of 08/03/22 (from the past 24 hour(s))   ECG 12 LEAD   Result Value Ref Range    Ventricular rate 98 BPM    Atrial Rate 98 BPM    PR Interval 152 ms    QRS Duration 92 ms    QT Interval 340 ms    QTC Calculation 434 ms    Calculated P Axis 46 degrees    Calculated R Axis 74 degrees    Calculated T Axis 63 degrees   DRUG SCREEN, NO CONFIRMATION, URINE   Result Value Ref Range    AMPHETAMINES URINE Negative Negative    BARBITURATES URINE Negative Negative    BENZODIAZEPINES URINE Positive (A) Negative    METHADONE URINE Negative Negative    COCAINE METABOLITES URINE Negative Negative    OPIATES URINE Negative Negative    PCP URINE Negative Negative    CANNABINOIDS URINE Positive (A) Negative   URINALYSIS, MACRO/MICRO   Result Value Ref Range    COLOR Light Yellow (A) Yellow    APPEARANCE Slightly Hazy (A) Clear    SPECIFIC GRAVITY >=1.030 1.003 - 1.035    PH 6.0 4.6 - 8.0    LEUKOCYTES Negative Negative WBCs/uL    NITRITE Negative Negative    PROTEIN 30 (A) Negative mg/dL    GLUCOSE Negative Negative mg/dL    KETONES Negative Negative mg/dL    BILIRUBIN Negative Negative mg/dL    BLOOD Moderate (A) Negative mg/dL    UROBILINOGEN 0.2 0.2 - 1.0 mg/dL   URINALYSIS, MICROSCOPIC   Result Value Ref Range    RBCS 3-5 (A) 0-3, Not Present /hpf    BACTERIA Few (A) Negative /hpf    AMORPHOUS SEDIMENT Many  (A) (none) /hpf    WBCS 11-15 (A) Not Present, Occasional, 0-5 /hpf    SQUAMOUS EPITHELIAL Few Not Present, Few /hpf   ETHANOL, SERUM   Result Value Ref Range    ETHANOL <3 <=3 mg/dL   COMPREHENSIVE METABOLIC PANEL, NON-FASTING   Result Value Ref Range    SODIUM 141 136 - 145 mmol/L    POTASSIUM 4.6 3.5 - 5.1 mmol/L    CHLORIDE 105 98 - 107 mmol/L    CO2 TOTAL 22 21 - 32 mmol/L    ANION GAP 14 (H) 4 - 13 mmol/L    BUN 4 (L) 7 - 18 mg/dL    CREATININE 4.09 8.11 - 1.30 mg/dL    BUN/CREA RATIO 3     ESTIMATED GFR 79 >59 mL/min/1.61m^2    ALBUMIN 3.8 3.4 - 5.0 g/dL    CALCIUM 8.2 (L) 8.5 - 10.1 mg/dL    GLUCOSE 77 74 - 914 mg/dL    ALKALINE PHOSPHATASE 109 46 - 116 U/L    ALT (SGPT) 16 <=78 U/L    AST (SGOT) 13 (L) 15 - 37 U/L    BILIRUBIN TOTAL 0.1 (L) 0.2 - 1.0 mg/dL    PROTEIN TOTAL 7.4 6.4 - 8.2 g/dL    ALBUMIN/GLOBULIN RATIO 1.1 0.8 - 1.4    OSMOLALITY, CALCULATED 277 270 - 290 mOsm/kg    CALCIUM, CORRECTED 8.4 mg/dL    GLOBULIN 3.6    CBC WITH DIFF   Result Value Ref Range    WBC 14.7 (H) 4.0 -  10.5 x10^3/uL    RBC 5.15 4.20 - 6.00 x10^6/uL    HGB 15.9 13.5 - 18.0 g/dL    HCT 47.447.1 25.942.0 - 56.351.0 %    MCV 91.4 78.0 - 99.0 fL    MCH 30.8 27.0 - 32.0 pg    MCHC 33.7 32.0 - 36.0 g/dL    RDW 87.515.3 (H) 64.311.6 - 14.8 %    PLATELETS 258 140 - 440 x10^3/uL    MPV 7.7 7.4 - 10.4 fL    NEUTROPHIL % 90 (H) 40 - 76 %    LYMPHOCYTE % 4 (L) 25 - 45 %    MONOCYTE % 5 0 - 12 %    EOSINOPHIL % 0 0 - 7 %    BASOPHIL % 0 0 - 3 %    NEUTROPHIL # 13.23 (H) 1.80 - 8.40 x10^3/uL    LYMPHOCYTE # 0.59 (L) 1.10 - 5.00 x10^3/uL    MONOCYTE # 0.77 0.00 - 1.30 x10^3/uL    EOSINOPHIL # 0.04 0.00 - 0.80 x10^3/uL    BASOPHIL # 0.03 0.00 - 0.30 x10^3/uL   TEGRETOL LEVEL   Result Value Ref Range    CARBAMAZEPINE LEVEL 4.5 4.0 - 12.0 ug/mL       Review of reports and notes reveal:   Independent Interpretation of images or specimens:  CT BRAIN WO IV CONTRAST  Result Date: 08/03/2022  Impression 1. As visualized, no acute intracranial abnormality. 2.  Postsurgical changes about the right temporal lobe. Short-term follow-up MRI without and with IV contrast could be considered for further evaluation if needed. One or more dose reduction techniques were used (e.g., Automated exposure control, adjustment of the mA and/or kV according to patient size, use of iterative reconstruction technique). Radiologist location ID: [id number]     XR AP MOBILE CHEST  Result Date: 08/03/2022  Impression NO ACUTE FINDINGS. 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 Patient known epileptic with 5 breakthrough seizures on 01/03/23. CT brain done outside facility was unrevealing. [facility] evaluation of breakthrough seizure, on arrival patient is unconscious likely secondary to postictal state and multiple ASM given as well as antipsychotic. Patient will be admitted to the General team for further evaluation.    Breakthrough seizures secondary to possible medication non compliance.  Known Epilepsy secondary to R-anterior temporal low grade glioma  Patient currently not back to baseline  CT brain showing unrevealing.   - Admit to neurology for further evaluation   - Seizure precautions, Aspiration precautions  - Telemetry, Neuro checks Q4, Vitals.  - Restart home Tegretol 400mg  BID and Briviact 50 mg BID.   - Will get more information from family about medication compliance regarding Briviact.  - Extended bedside vEEG  - CBC, BMP, Mg, Phos, TSH, LFTs  - Infectious work up to r/o provoking factors- UA, CXR  - MRI brain w/wo contrast to evaluate for any  structural lesions  - NPO until NAST passed     Vincente LibertyEverardo Castaneda MD  Neurology Resident       Senior addendum      Dorene SorrowJerry  Cristela BlueWoodie is a 32 y.o., White male who presents as a transfer from [facility] with PMH of lesional epilepsy (s/p resection)  Hepatitis C, ADHD, substance abuse disorder, Schizophrenia presents with breakthrough seizures. S/p Ativan 5mg , Valium 10mg , Keppra 1g, Geodon 20mg , Haldol 15mg  and Benadryl 100mg  IV total doses in the past 24 hrs for seizures and post-ictal agitation. Per chart review, he follows Dr. Sedalia Mutaox and has established care with Dr. Daisey MustFrey in Epilepsy clinic. Had good seizure freedom on 1 ASM following resection in August 2023, however had increasing number of breakthrough seizures since then. Failed Keppra (agitation) in the past. Concern for medication compliance with Briviact. Will admit and start cEEG, repeat MRI brain wwo and clarify ASM regime at home. Plan to either restart Briviact or switch ASMs for better mood and seizure dual benefit.       Vennie HomansSupriya Ramesha, MD       I saw and examined the patient.  I reviewed the resident's note.  I agree with the findings and plan of care as documented in the resident's note.  Any exceptions/additions are edited/noted.    Georgina PeerKristina Elizardo Chilson, MD

## 2022-08-04 NOTE — Pharmacy (Signed)
Pharmacy Medication Reconciliation    Patient Name: Purcell, Rhodd  Date of Service: 08/04/2022  Date of Admission: 08/04/2022  Date of Birth: November 23, 1990  Length of Stay:   0 days   Service: NEUROLOGY 1      Transitions of Care:  Discharge Pharmacy services were discussed with the patient and the Meds to [facility] flowsheet and preferred pharmacy information were updated in EMR if applicable and able to assess with patient.    Information was collected from:  Pharmacy and Caregiver  [facility] [id number]  Mitzi Davenport (mother) [phone]    Does the Patient have Prescription Coverage?       Clarified Prior to Admission Medications:  Prior to Admission medications    Medication Sig Taking Resumed Y/N (RPh) Comments   acetaminophen (TYLENOL ARTHRITIS ORAL) Take by mouth As needed Yes  Y  Patient takes as needed    brivaracetam (BRIVIACT) 50 mg Oral Tablet Take 1 Tablet (50 mg total) by mouth Twice daily for 180 days   Y  Mother not sure if patient was taking; Most recent pharmacy fill 07/13/2022 for 1 month    carBAMazepine (TEGRETOL  XR) 200 mg Oral Tablet Sustained Release 12 hr Take 2 Tablets (400 mg total) by mouth Twice daily for 180 days Yes  Y  Most recent pharmacy fill 07/13/2022 for 1 month    hydrOXYzine pamoate (VISTARIL) 25 mg Oral Capsule Take 1 Capsule (25 mg total) by mouth Three times a day as needed for Anxiety for up to 90 days Yes  N  Most recent pharmacy fill 07/13/2022 for 1 month    IBUPROFEN ORAL Take by mouth As needed Yes  N  Patient takes as needed          Did patient's home medication list require updates? Yes    Medications UPDATED on Prior to Admission Med List:  -None     Medications ADDED to Prior to Admission Med List:  -Acetaminophen  -Ibuprofen     Allergies:    Allergies   Allergen Reactions    Bee Venom Protein (Honey Bee)  Other Adverse Reaction (Add comment)       Quincy Simmonds, Pharmacy Technician    Did pharmacist make suggestions for medication reconciliation? no    Medications REMOVED from  home medication list:  -n/a    Pharmacist Recommendations:  -Of note, patient's mother was unsure if patient was taking brivaracetam prior to admission (currently ordered inpatient).    Valorie Roosevelt, PHARMD, BCPS

## 2022-08-04 NOTE — ED Nurses Note (Signed)
Pt attempting to get out of bed, pt still confused, attempted to reorient, unsuccessful. Pt states he has to pee, pt informed that nurses have to be at bedside for him to stand and pee. Pt became agitated, See MAR. Soft wrist restraints remain in place . [facility] here at this time to transport pt to [facility] with personal belongings and paperwork.

## 2022-08-04 NOTE — Nurses Notes (Signed)
Pt admitted to 1075. VS and assessment per flowsheet. SS in place, call light in reach. Dual skin assessment complete on admission. Service notified of admission      Pt arrived to unit in 4pt restraints. 8mg  of Versed given in route to hospital per EMS.

## 2022-08-04 NOTE — ED Nurses Note (Signed)
Report called to Dorathy Daft, Charity fundraiser at [facility]. All questions and concerns answered. Recent lab values and imaging results told to oncoming nurse. R-upper arm -20G-SL.

## 2022-08-04 NOTE — Care Management Notes (Deleted)
[facility]  Care Management Initial Evaluation    Patient Name: [name] [name]  Date of Birth: [birth year]  Sex: male  Date/Time of Admission: 08/04/2022  6:21 AM  Room/Bed: 1075/A  Payor[place] MEDICA[place] / Plan: [place] MEDICA[place] / Product Type: Medicaid [place] /   Primary Care Providers:  Cleda Clarks, DO, DO (General)    Pharmacy Info:   Preferred Pharmacy       None          Emergency Contact Info:   Extended Emergency Contact Information  Primary Emergency Contact: Summey,Brianna  Mobile Phone: [phone]  Relation: Wife  Secondary Emergency Contact: Maximino Greenland  Mobile Phone: [phone]  Relation: Mother    History:   Rigoverto Stonebarger is a 32 y.o., male, admitted with seizures    Height/Weight:   /       LOS: 0 days   Admitting Diagnosis: Breakthrough seizure (CMS [facility]) [G40.919]    Assessment:      08/04/22 1545   Assessment Details   Assessment Type Admission   Date of Care Management Update 08/04/22   Date of Next DCP Update 08/07/22   Employment/Financial   Patient has Prescription Coverage?  Yes        Name of Insurance Coverage for Medications [place]   Financial Concerns none   Living Environment   Select an age group to open "lives with" row.  Adult   Lives With spouse   Living Arrangements mobile home   Able to Return to Prior Arrangements yes   Home Safety   Home Assessment: No Problems Identified   Care Management Plan   Discharge Planning Status initial meeting   Projected Discharge Date 08/07/22   CM will evaluate for rehabilitation potential yes   Discharge Needs Assessment   Equipment Currently Used at Home none   Equipment Needed After Discharge none   Discharge Facility/Level of Care Needs Home (Patient/Family Member/other)(code 1)   Transportation Available car;family or friend will provide   Referral Information   Admission Type inpatient   Address Verified verified-no changes   Arrived From acute hospital, other   Acute Care Facility Other - See Comments  [facility])   ADVANCE  DIRECTIVES   Does the Patient have an Advance Directive? No, Information Offered and Refused   LAY CAREGIVER    Appointed Lay Caregiver? I Decline         Discharge Plan:  Home (Patient/Family Member/other) (code 1)  32yo male admitted with seizures from [facility] in stepdown for close monitoring while undergoing workup. Patient lives with wife in mobile home. No medical equipment or HH services used. PCP and pharmacy verified. No advance directive. Will follow for any identified d/c needs.    The patient will continue to be evaluated for developing discharge needs.     Case Manager: Cynda Acres, CLINICAL CARE COORDINATOR  Phone: [phone]

## 2022-08-04 NOTE — Nurses Notes (Signed)
Restraint Initiation    Patient assessed and found to have the following condition AMS         Least restrictive alternatives attempted: Considered 1:1 sit, Increase patient observation, moved closer to the nurses station, bed-exit alarm/motion detector, call light accessible, concealed tubes/lines, decreased/removed stimulus, frequent orientation, involved patient in conversation, provided familiar/personal items, reoriented to person, place, time, and treatment, redirected behavior, and assessed meds/medical problems.    Patient is exhibiting the following behaviors: agitated, kicking, and hitting.    The reason for application of restraint: patient attempting to pull at IV lines and patient is attempting to get out of bed,    The restraint was applied to facilitate medical/surgical treatment to ensure safety.    The patient will continue to be evaluated and assessments documented on the flowsheet to ensure that the patient is released from the restraint at the earliest possible time.      Family aware of restraints-- mother shelby Hinderman

## 2022-08-05 DIAGNOSIS — I498 Other specified cardiac arrhythmias: Secondary | ICD-10-CM

## 2022-08-05 DIAGNOSIS — R001 Bradycardia, unspecified: Secondary | ICD-10-CM

## 2022-08-05 LAB — CBC WITH DIFF
BASOPHIL #: <0.1 10*3/uL (ref <=0.20)
BASOPHIL %: 0.8 %
EOSINOPHIL #: 0.11 10*3/uL (ref <=0.50)
EOSINOPHIL %: 1.4 %
HCT: 43.1 % (ref 38.9–52.0)
HGB: 15.2 g/dL (ref 13.4–17.5)
IMMATURE GRANULOCYTE #: <0.1 10*3/uL (ref <0.10)
IMMATURE GRANULOCYTE %: 0.4 % (ref 0.0–1.0)
LYMPHOCYTE #: 2.27 10*3/uL (ref 1.00–4.80)
LYMPHOCYTE %: 29.1 %
MCH: 31.1 pg (ref 26.0–32.0)
MCHC: 35.3 g/dL (ref 31.0–35.5)
MCV: 88.3 fL (ref 78.0–100.0)
MONOCYTE #: 0.65 10*3/uL (ref 0.20–1.10)
MONOCYTE %: 8.3 %
MPV: 9.9 fL (ref 8.7–12.5)
NEUTROPHIL #: 4.69 10*3/uL (ref 1.50–7.70)
NEUTROPHIL %: 60 %
PLATELETS: 228 10*3/uL (ref 150–400)
RBC: 4.88 10*6/uL (ref 4.50–6.10)
RDW-CV: 12.3 % (ref 11.5–15.5)
WBC: 7.8 10*3/uL (ref 3.7–11.0)

## 2022-08-05 LAB — BASIC METABOLIC PANEL
ANION GAP: 6 mmol/L (ref 4–13)
BUN/CREA RATIO: 11 (ref 6–22)
BUN: 15 mg/dL (ref 8–25)
CALCIUM: 9.2 mg/dL (ref 8.6–10.2)
CHLORIDE: 110 mmol/L (ref 96–111)
CO2 TOTAL: 23 mmol/L (ref 22–30)
CREATININE: 1.36 mg/dL — ABNORMAL HIGH (ref 0.75–1.35)
ESTIMATED GFR - MALE: 70 mL/min/BSA (ref >=60)
GLUCOSE: 90 mg/dL (ref 65–125)
POTASSIUM: 3.6 mmol/L (ref 3.5–5.1)
SODIUM: 139 mmol/L (ref 136–145)

## 2022-08-05 LAB — ECG 12-LEAD
Atrial Rate: 56 {beats}/min
Calculated P Axis: 38 degrees
Calculated R Axis: 71 degrees
Calculated T Axis: 65 degrees
PR Interval: 178 ms
QRS Duration: 104 ms
QT Interval: 412 ms
QTC Calculation: 397 ms
Ventricular rate: 56 {beats}/min

## 2022-08-05 LAB — PHOSPHORUS: PHOSPHORUS: 3.4 mg/dL (ref 2.4–4.7)

## 2022-08-05 LAB — MAGNESIUM: MAGNESIUM: 2.3 mg/dL (ref 1.8–2.6)

## 2022-08-05 MED ORDER — BRIVARACETAM 10 MG/ML ORAL SOLUTION
50.0000 mg | Freq: Two times a day (BID) | ORAL | Status: DC
Start: 2022-08-05 — End: 2022-08-05

## 2022-08-05 MED ORDER — BRIVARACETAM 10 MG/ML ORAL SOLUTION
100.0000 mg | Freq: Two times a day (BID) | ORAL | Status: DC
Start: 2022-08-05 — End: 2022-08-05
  Administered 2022-08-05: 100 mg via ORAL
  Filled 2022-08-05: qty 10

## 2022-08-05 NOTE — Discharge Summary (Signed)
[facility]  DISCHARGE SUMMARY    PATIENT NAME:  Bartow, Harstad  MRN:  [id number]  DOB:  12-02-1990    ENCOUNTER DATE:  08/04/2022  INPATIENT ADMISSION DATE: 08/04/2022  DISCHARGE DATE:  08/05/2022    ATTENDING PHYSICIAN: No att. providers found  SERVICE: NEUROLOGY 1  PRIMARY CARE PHYSICIAN: Cleda Clarks, DO     No lay caregiver identified.    PRIMARY DISCHARGE DIAGNOSIS: Breakthrough seizure (CMS [facility])  Active Hospital Problems    Diagnosis Date Noted    Principal Problem: Breakthrough seizure (CMS [facility]) [G40.919] 08/04/2022      Resolved Hospital Problems   No resolved problems to display.     Active Non-Hospital Problems    Diagnosis Date Noted    R temporal brain lesion s/p crani for resection 12/12/2021    Attention deficit hyperactivity disorder (ADHD) 09/17/2021    History of substance abuse (CMS HCC) 09/17/2021    Seizure (CMS HCC) 09/17/2021    Vitamin D deficiency 09/17/2021    Acute appendicitis 08/25/2021    Viral hepatitis C 01/17/2018           Current Discharge Medication List        CONTINUE these medications - NO CHANGES were made during your visit.        Details   brivaracetam 50 mg Tablet  Commonly known as: Briviact   Take 1 Tablet (50 mg total) by mouth Twice daily for 180 days  Qty: 180 Tablet  Refills: 1     carBAMazepine 200 mg Tablet Sustained Release 12 hr  Commonly known as: TEGretol  XR   Take 2 Tablets (400 mg total) by mouth Twice daily for 180 days  Qty: 360 Tablet  Refills: 1     hydrOXYzine pamoate 25 mg Capsule  Commonly known as: VISTARIL   Take 1 Capsule (25 mg total) by mouth Three times a day as needed for Anxiety for up to 90 days  Qty: 270 Capsule  Refills: 0     IBUPROFEN ORAL   Take by mouth As needed  Refills: 0     TYLENOL ARTHRITIS ORAL   Take by mouth As needed  Refills: 0            Discharge med list refreshed?  YES   NEUROLOGY RISK FACTORS:  -None of the following conditions apply    Allergies   Allergen Reactions    Bee Venom Protein (Honey Bee)  Other Adverse  Reaction (Add comment)     HOSPITAL PROCEDURE(S):   No orders of the defined types were placed in this encounter.      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 Patient known epileptic with x 5 breakthrough seizures on 4/8. CT brain done outside facility was unrevealing. Patient transferred to [facility] for further evaluation of breakthrough seizure. On arrival, patient was unconscious likely secondary to combination of postictal state and administration of multiple ASM and an antipsychotic. Patient admitted to General Neurology service for further evaluation. He was placed on continuous vEEG monitoring overnight and home ASM verified and resumed. EEG showed right temporal potential epileptogenicity and nonspecific cerebral dysfunction, no seizures recorded. The following morning, patient reported feeling much better, "just a little slow" which is typical  for him following seizures. Neurological examination was unremarkable. Of note, patient shared that he had not been taking his Briviact, filled last month. Medication noncompliance most likely contributing to recent seizures. He was educated on importance of ASM compliance in setting of epilepsy. MRI brain was ordered to assess for potential structural lesions given above history, however patient deferred neuroimaging while inpatient as he would likely have to stay another night and was adamant about going home. Therefore, outpatient MRI was ordered. He was instructed to follow up with his local neurologist in [place], Dr. Sedalia Muta. Patient stable for discharge home with family. Questions and concerns were addressed prior to discharge.     TRANSITION/POST DISCHARGE CARE/PENDING TESTS/REFERRALS:   Follow  up with Neurology (Dr. Sedalia Muta) in [place]  Outpatient MRI brain ordered    CONDITION ON DISCHARGE:  A. Ambulation: Full ambulation  B. Self-care Ability: Complete  C. Cognitive Status Alert and Oriented x 3  D. Code status at discharge:       LINES/DRAINS/WOUNDS AT DISCHARGE:   Patient Lines/Drains/Airways Status       Active Line / Dialysis Catheter / Dialysis Graft / Drain / Airway / Wound       None                    DISCHARGE DISPOSITION:  Home discharge  DISCHARGE INSTRUCTIONS:  Post-Discharge Follow Up Appointments       Friday Dec 11, 2022    Return Patient Visit with Dyke Brackett, DO at 11:40 AM      Thursday Dec 24, 2022    MRI with POC MRIPOC* at  7:45 AM    Return Patient Visit with Terrilyn Saver, APRN,FNP-BC at  9:00 AM      Imaging Services - MRI, Physician [facility]  Physician [facility], [facility]  [address]  [place] [address]  [phone] Neurology, [facility]  [facility] [place]  [address]  [place] [address]  [phone] Neurosurgery, Physician [facility], [facility]  [address]  [place] [address]  [phone]             MRI BRAIN W/WO CONTRAST           Reason for exam: Breakthrough seizure, evaluate for any structural lesions    Does the patient have a pacemaker or defibrillator? No    What is the patient's weight in lbs? 84.3 kg (185 lb 12.8 oz) (08/04/2022)    Is the patient claustrophobic? No    Does the patient require anesthesia services for this test? No    What is the patient's height? 1.778 m (5\' 10" ) (08/04/2022)    Preferred location: [facility]           Daleen Bo, MD    Copies sent to Care Team         Relationship Specialty Notifications Start End    Cleda Clarks, DO PCP - General EXTERNAL  10/17/19     Phone: [phone] Fax: [phone]         [address] [place] [address]            Referring providers can utilize [email or web address]  to access their referred [facility] Medicine patient's information.          Georgina Peer, MD

## 2022-08-05 NOTE — Care Plan (Addendum)
Patient is being discharged home with family.   I went over all discharge instructions, follow up care, medications, and appointments with patient and mother. All questions were answered. PIV removed. Personal belongings sent with patient.

## 2022-08-05 NOTE — Progress Notes (Signed)
[facility]  Neurology Progress Note    [name] [name], [name] [name], 32 y.o. male  Date of Admission:  08/04/2022  Date of service: 08/05/2022  Date of Birth:  07-03-1990    Chief Complaint: Breakthrough Seizure  Pt's condition today: stable    Subjective: NAEO. This morning, patient seen and examined at bedside. A/Ox 3. Following commands. No seizure activity reported overnight. Will follow up on official report.    Vital Signs:  Temp (24hrs) Max:37.1 C (98.8 F)      Systolic (24hrs), Avg:125 , Min:112 , Max:162     Diastolic (24hrs), Avg:78, Min:59, Max:105    Temp  Avg: 36.9 C (98.4 F)  Min: 36.4 C (97.5 F)  Max: 37.1 C (98.8 F)  MAP (Non-Invasive)  Avg: 91.9 mmHG  Min: 78 mmHG  Max: 113 mmHG  Pulse  Avg: 54.4  Min: 42  Max: 75  Resp  Avg: 16  Min: 14  Max: 18  SpO2  Avg: 95.3 %  Min: 92 %  Max: 99 %       Today's Physical Exam:  General: Alert  Mental status: Alert and oriented x 3  Memory: Registration, Recall, and Following of commands is normal  Attention: Attention and Concentration are normal  Knowledge: Good  Language and Speech: Normal and Normal  Cranial nerves:   Cranial nerves 2-12 are normal  Muscle tone: WNL  Motor strength: Motor strength is normal throughout.  Sensory: Sensory exam in the upper and lower extremities is normal  Gait: Normal and unable to assess. Reason: fall risk  Coordination: Coordination is normal without tremor  Reflexes: Reflexes are 2/2 throughout    Current Medications:  acetaminophen (TYLENOL) tablet, 650 mg, Oral, Q4H PRN  brivaracetam (BRIVIACT) 10 mg/mL oral liquid, 50 mg, Oral, 2x/day  carBAMazepine (CARBATROL) extended release capsule, 400 mg, Oral, 2x/day  enoxaparin PF (LOVENOX) 40 mg/0.4 mL SubQ injection, 40 mg, Subcutaneous, Q24H  hydrOXYzine pamoate (VISTARIL) capsule, 25 mg, Oral, 3x/day PRN  NS premix infusion, , Intravenous, Continuous  sennosides-docusate sodium (SENOKOT-S) 8.6-50mg  per tablet, 1 Tablet, Oral, 2x/day PRN        NS premix infusion, , Last  Rate: 75 mL/hr at 08/04/22 0729    I/O:  I/O last 24 hours:    Intake/Output Summary (Last 24 hours) at 08/05/2022 0646  Last data filed at 08/04/2022 2000  Gross per 24 hour   Intake 480 ml   Output 1075 ml   Net -595 ml     I/O current shift:  04/09 1900 - 04/10 0659  In: 120 [P.O.:120]  Out: -   Last BM: Date of Last Bowel Movement:  (pta)    Labs:  Reviewed: I have reviewed all lab results.  Lab Results Today:    Results for orders placed or performed during the hospital encounter of 08/04/22 (from the past 24 hour(s))   URINALYSIS, MACROSCOPIC   Result Value Ref Range    SPECIFIC GRAVITY 1.009 1.005 - 1.030    GLUCOSE Negative Negative mg/dL    PROTEIN Negative Negative mg/dL    BILIRUBIN Negative Negative mg/dL    UROBILINOGEN Negative Negative mg/dL    PH 5.0 5.0 - 8.0    BLOOD Small (A) Negative mg/dL    KETONES Negative Negative mg/dL    NITRITE Negative Negative    LEUKOCYTES Negative Negative WBCs/uL    APPEARANCE Clear Clear    COLOR Normal (Yellow) Normal (Yellow)   URINALYSIS, MICROSCOPIC   Result Value Ref Range  WBCS 4.0 (H) <4.0 /hpf    RBCS 5.0 <6.0 /hpf    BACTERIA Occasional or less Occasional or less /hpf    HYALINE CASTS 1.0 <4.0 /lpf    MUCOUS Light Light /lpf   BASIC METABOLIC PANEL   Result Value Ref Range    SODIUM 138 136 - 145 mmol/L    POTASSIUM 4.0 3.5 - 5.1 mmol/L    CHLORIDE 109 96 - 111 mmol/L    CO2 TOTAL 20 (L) 22 - 30 mmol/L    ANION GAP 9 4 - 13 mmol/L    CALCIUM 9.0 8.6 - 10.2 mg/dL    GLUCOSE 75 65 - 544 mg/dL    BUN 11 8 - 25 mg/dL    CREATININE 9.20 (H) 0.75 - 1.35 mg/dL    BUN/CREA RATIO 7 6 - 22    ESTIMATED GFR - MALE 60 >=60 mL/min/BSA   MAGNESIUM   Result Value Ref Range    MAGNESIUM 2.5 1.8 - 2.6 mg/dL   PHOSPHORUS   Result Value Ref Range    PHOSPHORUS 3.5 2.4 - 4.7 mg/dL   HEPATIC FUNCTION PANEL   Result Value Ref Range    ALBUMIN 3.8 3.5 - 5.0 g/dL     ALKALINE PHOSPHATASE 117 (H) 45 - 115 U/L    ALT (SGPT) 13 10 - 55 U/L    AST (SGOT)  26 8 - 45 U/L    BILIRUBIN  TOTAL 0.3 0.3 - 1.3 mg/dL    BILIRUBIN DIRECT 0.1 0.1 - 0.4 mg/dL    PROTEIN TOTAL 7.1 6.4 - 8.3 g/dL   CBC WITH DIFF   Result Value Ref Range    WBC 10.2 3.7 - 11.0 x10^3/uL    RBC 4.88 4.50 - 6.10 x10^6/uL    HGB 14.9 13.4 - 17.5 g/dL    HCT 10.0 71.2 - 19.7 %    MCV 89.8 78.0 - 100.0 fL    MCH 30.5 26.0 - 32.0 pg    MCHC 34.0 31.0 - 35.5 g/dL    RDW-CV 58.8 32.5 - 49.8 %    PLATELETS 234 150 - 400 x10^3/uL    MPV 9.9 8.7 - 12.5 fL    NEUTROPHIL % 73.4 %    LYMPHOCYTE % 16.2 %    MONOCYTE % 8.8 %    EOSINOPHIL % 0.3 %    BASOPHIL % 0.7 %    NEUTROPHIL # 7.50 1.50 - 7.70 x10^3/uL    LYMPHOCYTE # 1.66 1.00 - 4.80 x10^3/uL    MONOCYTE # 0.90 0.20 - 1.10 x10^3/uL    EOSINOPHIL # <0.10 <=0.50 x10^3/uL    BASOPHIL # <0.10 <=0.20 x10^3/uL    IMMATURE GRANULOCYTE % 0.6 0.0 - 1.0 %    IMMATURE GRANULOCYTE # <0.10 <0.10 x10^3/uL   BASIC METABOLIC PANEL, NON-FASTING   Result Value Ref Range    SODIUM 139 136 - 145 mmol/L    POTASSIUM 3.6 3.5 - 5.1 mmol/L    CHLORIDE 110 96 - 111 mmol/L    CO2 TOTAL 23 22 - 30 mmol/L    ANION GAP 6 4 - 13 mmol/L    CALCIUM 9.2 8.6 - 10.2 mg/dL    GLUCOSE 90 65 - 264 mg/dL    BUN 15 8 - 25 mg/dL    CREATININE 1.58 (H) 0.75 - 1.35 mg/dL    BUN/CREA RATIO 11 6 - 22    ESTIMATED GFR - MALE 70 >=60 mL/min/BSA   MAGNESIUM   Result Value Ref Range  MAGNESIUM 2.3 1.8 - 2.6 mg/dL   PHOSPHORUS   Result Value Ref Range    PHOSPHORUS 3.4 2.4 - 4.7 mg/dL   CBC WITH DIFF   Result Value Ref Range    WBC 7.8 3.7 - 11.0 x10^3/uL    RBC 4.88 4.50 - 6.10 x10^6/uL    HGB 15.2 13.4 - 17.5 g/dL    HCT 16.143.1 09.638.9 - 04.552.0 %    MCV 88.3 78.0 - 100.0 fL    MCH 31.1 26.0 - 32.0 pg    MCHC 35.3 31.0 - 35.5 g/dL    RDW-CV 40.912.3 81.111.5 - 91.415.5 %    PLATELETS 228 150 - 400 x10^3/uL    MPV 9.9 8.7 - 12.5 fL    NEUTROPHIL % 60.0 %    LYMPHOCYTE % 29.1 %    MONOCYTE % 8.3 %    EOSINOPHIL % 1.4 %    BASOPHIL % 0.8 %    NEUTROPHIL # 4.69 1.50 - 7.70 x10^3/uL    LYMPHOCYTE # 2.27 1.00 - 4.80 x10^3/uL    MONOCYTE # 0.65 0.20 - 1.10  x10^3/uL    EOSINOPHIL # 0.11 <=0.50 x10^3/uL    BASOPHIL # <0.10 <=0.20 x10^3/uL    IMMATURE GRANULOCYTE % 0.4 0.0 - 1.0 %    IMMATURE GRANULOCYTE # <0.10 <0.10 x10^3/uL       Review of reports and notes reveal:   CT BRAIN WO IV CONTRAST performed on 08/03/2022 4:35 PM  IMPRESSION:  1. As visualized, no acute intracranial abnormality.  2. Postsurgical changes about the right temporal lobe. Short-term follow-up MRI without and with IV contrast could be considered for further evaluation if needed.    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 Patient known epileptic with 5 breakthrough seizures on 01/03/23. CT brain done outside facility was unrevealing. [facility] breakthrough seizure, on arrival patient is unconscious likely secondary to postictal state and multiple ASM given as well as antipsychotic. Patient will be admitted to the General team for further evaluation.     Breakthrough Seizures  Known Epilepsy secondary to R-anterior temporal low grade glioma  - CT brain showing unrevealing.   - Seizure precautions, Aspiration precautions  - Telemetry, Neuro checks Q4, Vitals  - Restart home Tegretol 400 mg BID and Briviact 50 mg BID  - Extended bedside vEEG  - CBC, BMP, Mg, Phos, TSH, LFTs  - MRI brain w/wo contrast to evaluate for any structural lesions - pending      DVT/PE Prophylaxis - Enoxaparin  Consults - none  Hardware (Lines, Drains, Foley, Tubes) - PIV  Diet - Regular  Activity - Up w/ assistance and Up in chair TID w/ all meals  Therapy -  PT/OT  Disposition - TBD      Daleen BoAlejandro Chavez, MD  08/05/22  Neurology PGY-1  [facility]  Pager # [phone]      I saw and examined the patient.  I reviewed the resident's note.  I agree with the findings and plan of care as documented in the resident's  note.  Any exceptions/additions are edited/noted.    Gemma Payor, MD

## 2022-08-05 NOTE — Care Plan (Signed)
Pt has remained alert & oriented x4. VSS. No complaints of pain. OOB standby to bathroom. VEEG remains on. SS and seizure precautions in place. Call bell within reach. Mother at bedside. Needs an MRI.

## 2022-08-05 NOTE — Care Management Notes (Signed)
Patient d/c prior to initial assessment. No apparent home needs.

## 2022-08-05 NOTE — Procedures (Addendum)
[facility]    Neurodiagnostics Report    Date of Service:  08/04/22     Patient Name: [name] [name]   Medical Record Number: [id number]   DOB:  06-Jul-1990    Age: 32 y.o.       Gender: male     Referring Physician: Dr. Shawnie Dapper     PROCEDURE: Continuous EEG monitoring with video    Patient Clinical Information   Reason for Study: A 32 y.o. 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      Patient State: drowsy    Pertinent Medications and Treatments  Medications: Brivarecetam 50 mg BID, Carbamazepine 400 mg BID    Sedatives administered: No   Intubated: No   Pharmacological paralytic: No       EEG Description  This EEG was acquired using the standard 10-20 electrode placement system.     ----------------------------------------------------------------------------------------------    Day - 1   Start: 08/04/22 08:17  End: 08/05/22 09:59        EEG FINDINGS:  Background:      Occipital rhythm (posterior dominant rhythm, or PDR):  Absent   Other background activity: continuous right temporal irregular delta activity; increased voltage of background rhythms in the right temporal region (T8-breach effect); continuous generalized irregular theta> delta activity.    Sleep: N2 sleep present with K complexes and sleep spindles      Technical and Activation Procedures:    Photic stimulation: not performed     Hyperventilation: not performed                 Abnormalities:    I. Seizures?      No       II. Rhythmic or Periodic Patterns?    No      III. Other Abnormalities?    Yes   1. Occasional right temporal (T8>P8,F8) sharp waves  2. Continuous right temporal irregular delta activity  3. Continuous generalized irregular theta> delta activity          Summary    EEG Diagnosis:  Abnormal EEG because of:    1. Occasional right temporal sharp waves  2. Continuous right temporal irregular delta activity  3. Continuous generalized  irregular theta> delta activity  4. Absent PDR         Clinical Interpretation:  This abnormal is indicative of right temporal potential epileptogenicity and nonspecific cerebral dysfunction. In addition, there was evidence of gradually improving moderate generalized nonspecific cerebral dysfunction. No seizures were recorded. No patient events were marked.     A breach effect was seen in a known area of skull defect.               _________________________________  April Manson, MD       I have reviewed the EEG and agree with the findings of this report. Any additions, exceptions or clarifications are noted above.    Rise Patience, MD  Assistant Professor of Neurology   [facility]   Attending Epileptologist

## 2022-08-11 NOTE — ED Provider Notes (Addendum)
ED Provider Note    Subjective     HPI    Chief Complaint   Patient presents with   . Altered mental status   . Hallucinations   . Paranoid     [name] Lumbard Justinryan Sollie. is a 32 y.o. male who presents brought in by [facility].  Has had multiple stays in mental health facilities in the past. Is concerned that he has been persecuted and followed and possibly poisoned by thallium being given to him by members of a gang called "[employer]." He saw someone murdered in front of him.    He has had multiple convulsions that he believes is being caused by the thallium.  He admits to prior mental health diagnoses but denies taking any mental health medicines currently.    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  Refer to a thoughtful neurology office visit documented 05/05/2022.    Social History:  Social History     Tobacco Use   . Smoking status: Every Day     Packs/day: 0.50     Years: 3.00     Additional pack years: 0.00     Total pack years: 1.50     Types: Cigarettes   . Smokeless tobacco: Never   Substance Use Topics   . Alcohol use: No   . Drug use: Yes     Types: Marijuana      Family History: As documented.  Past Medical History:   Diagnosis Date   . Seizures (HCC)      Past Surgical History:   Procedure Laterality Date   . HX APPENDECTOMY         Objective     Physical Exam    Vitals:  Vitals:    08/11/22 1011 08/11/22 1330 08/11/22 1545   BP: (!) 140/95 (!) 161/93 (!) 160/90   Patient Position: Sitting  Sitting   Pulse: 58 80 82   Resp: 20 20 20    Temp: 98.5 F (36.9 C) 97.9 F (36.6 C)    SpO2: 98% 95% 96%   Weight: 88.5 kg (195 lb)     Height: 1.854 m (6\' 1" )       ED Triage Vitals:  ED Triage Vitals [08/11/22 1011]   BP Pulse Resp Temp SpO2 Flow (L/min) (Oxygen Therapy)   (!) 140/95 58 20 98.5 F (36.9 C) 98 % --     Vital signs reviewed, nursing notes  reviewed.  Constitutional: well-developed, no acute distress  Eyes: No conjunctival injection  ENT: Atraumatic ears/nose, oropharynx clear and moist  Neck: No stridor  Cardiovascular: Strong peripheral pulse which is regular and at a normal rate; no evident cyanosis  Respiratory: Normal effort, without audible wheezes or retractions  Musculoskeletal: No deformity  Skin: Warm and dry  Neurological: Alert and conversant, normal speech normal cranial nerve exam, normal extremity strength without ataxia  Psychiatric: No internal stimuli but large universe of ideas about persecution and poisoning; acknowledges prior diagnosis of mental illness but states he is not currently taking any medications for mental illness.    Pertinent Lab Findings and Radiology Report:  Results for orders placed or performed during the hospital encounter of 08/11/22   SARS COV 2, INFLU. A/B, RSV PCR (COFRP)    Specimen: NASOPHARYNX   Result Value Ref Range    Specimen Nasal Swab  SARS-CoV-2 Not Detected Not Detected    Interpretative Comment       This test has been authorized by FDA under an Emergency Use  Authorization (EUA) for use by authorized laboratories. Please review the "Fact  Sheets" for health care providers, and patients and the FDA authorized labeling  available on the [facility] website: www.[facility].com/Covid19.      Influenza A PCR Not Detected Not Detected    Influenza B PCR Not Detected Not Detected    Respiratory Syncytial Virus PCR Not Detected Not Detected    First test No     Employed In Healthcare No     Symptomatic as defined by CDC Not given     Date of symptom onset Not given     Hospitalized Not given     ICU Not given     Resident in a congregate care setting No     Is patient pregnant Not given     Race White or Caucasian     Ethnicity       Non  Hispanic  Lab studies performed by: [employer] located at [facility], [address]  [place], [place] [address]     CT HEAD/BRAIN WO CONT    Narrative    [name] [name]  01-20-1991  [id number]    PROCEDURE: CT HEAD/BRAIN WO CONT      HISTORY: Mental status change, unknown cause  history of prior neurosurgery  .    COMPARISON: October 08, 2010.    TECHNIQUE: Axial noncontrast CT imaging was performed from the skull base to the vertex. Images were reviewed in multiple windows and projections.      RADIATION DOSE REDUCTION: One or more of the following radiation dose techniques were used for this examination: Automated exposure control, adjustment of the mA and/or kV according to patient size, or the use of iterative reconstruction technique.    FINDINGS: There is evidence of prior right craniotomy with adjacent focal encephalomalacia.    The brain appears otherwise unremarkable. There is no mass effect, acute hemorrhage, shift of the midline structures, or extra-axial collection. There is no significant white matter disease. The ventricles and CSF spaces appear within normal limits.    The visualized paranasal sinuses and mastoid air cells appear clear.    IMPRESSION:    Right craniotomy.    No acute abnormality.   COMPREHENSIVE METABOLIC PANEL(COMP)   Result Value Ref Range    Sodium 139 136 - 148 MMOL/L    Potassium 3.0 (L) 3.5 - 5.2 MMOL/L    Chloride 99 98 - 108 MMOL/L    CO2 28 20 - 32 MMOL/L    Urea Nitrogen 12 7 - 23 MG/DL    Creatinine 6.57 8.46 - 1.30 MG/DL    Glucose, Bld 99 74 - 106 mg/dL    Total Protein 8.6 (H) 5.7 - 8.2 G/DL    Albumin 4.2 3.2 - 5.0 G/DL    Calcium 9.8 8.5 - 96.2 MG/DL    Total Bilirubin 0.4 0.3 - 1.2 MG/DL    Alkaline Phosphatase, Serum 120 (H) 46 - 116 U/L    AST 14 (L) 15 - 37 U/L    ALT 17 16 - 63 U/L    Globulin 4.4 (H) 1.7 - 3.9 G/DL    Albumin/Globulin 1.0 0.7 - 2.3 RATIO    Anion Gap 12 3 - 12 mmol/L    Osmolality Calc 288 278 - 305 MOS/KG    Bun/Creatinine 12.0 7 - 20  Glom Filt Rate, Estimated >90 >60 ml/min/1.25m2   CBC WITH AUTO DIFF (CBCD)   Result Value Ref Range    WBC 10.9 (H) 4.0 - 10.5 K/uL    RBC 5.36 (H) 4.5 - 5.3 M/uL    Hemoglobin  16.4 (H) 12.0 - 16.0 g/dL    Hematocrit 01.0 37 - 49 %    MCV 86.9 78 - 98 fL    MCH 30.6 27.0 - 34.6 pg    MCHC 35.2 30.0 - 35.4 g/dL    RDW 27.2 53.6 - 64.4 %    Platelet Count 292 130 - 400 K/uL    MPV 9.9 9.4 - 12.4 fL    Immature Granulocyte 0.2 %    Seg 77.5 %    Lymph 16.1 %    Monos 5.3 %    Eos 0.6 %    Baso 0.3 %    Absolute Neut 8.4 (H) 1.8 - 7.7 K/uL    Absolute Lymph 1.8 1.0 - 5.0 K/uL    Absolute Mono 0.6 0 - 0.8 K/uL    Absolute Eos 0.1 0.0 - 0.4 K/uL    Absolute Basophils 0.0 0.0 - 0.2 K/uL    Absolute Immature Granulocyte 0.0 0.00 - 0.02 K/uL   DRUG SCREEN 12, UNCONFIRM URINE (UD12)   Result Value Ref Range    THC, Urine Pos (A) Neg    Phencyclidine, Urine Neg Neg    Cocaine, Urine Neg Neg    MethamphetAMINE & MDMA,Urine Neg Neg    Opiates, Urine Neg Neg    Amphetamine, Urine Neg Neg    Benzodiazepine, Urine Pos (A) Neg    Tricyclic Antidepressant, Urine Neg Neg    Methadone, Urine Neg Neg    Barbiturates, Urine Neg Neg    Oxycodone, Urine Neg Neg    Buprenorphine, Urine Neg Neg   ALCOHOL (ALCOH)   Result Value Ref Range    Alcohol <0.003 <0.003 % WT/VOL    Collection Tech Id [id number]     Iodine Prep Used? Unknown (A) Yes         Assessment and Plan:     ED COURSE, DIFFERENTIAL DIAGNOSIS AND MEDICAL DECISION MAKING:  32 y.o.male presents to the ED with complaint of paranoid thoughts evaluated by [facility] and arrangements were made for admission to [facility].  We appreciate the assistance.  Labs unremarkable except for evidence of THC and benzodiazepine use.  CT head shows no acute findings.    Final Impression:  1. Paranoia (HCC)      Condition: Stable

## 2022-08-12 ENCOUNTER — Telehealth (INDEPENDENT_AMBULATORY_CARE_PROVIDER_SITE_OTHER): Payer: Self-pay | Admitting: NEUROLOGY

## 2022-08-12 NOTE — Telephone Encounter (Signed)
Patient is booked at [facility] for MRI on April 29th at 1:45

## 2022-08-24 IMAGING — MR MRI BRAIN WITHOUT AND WITH CONTRAST
11 of 12 series · 39 of 48 positions shown · IV contrast (gadavist)
Comparison: None available.

﻿EXAM:  31115   MRI BRAIN WITHOUT AND WITH CONTRAST
INDICATION: Headaches, dizziness.
TECHNIQUE: Multiplanar, multisequence MRI was performed both before and after the intravenous administration of 10 mL Gadavist.

[Series 5: DWI · axial · 5.0mm · 1.35mm/px · z∈[-40,+86]mm · 9 of 88 slices shown (1 of 3)]
[im 1/88]
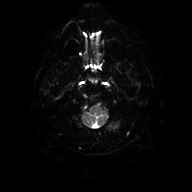
[im 15/88]
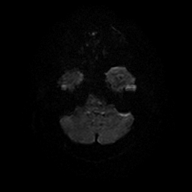
[im 30/88]
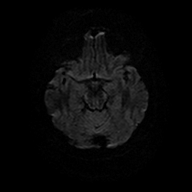
[im 37/88]
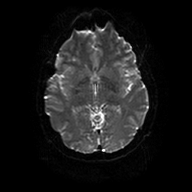
[im 44/88]
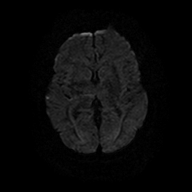
[im 51/88]
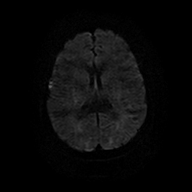
[im 59/88]
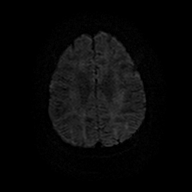
[im 73/88]
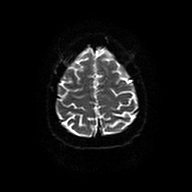
[im 88/88]
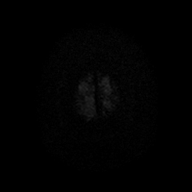

[Series 6: DWI · axial · 5.0mm · 1.35mm/px · z∈[-40,+86]mm · 3 of 22 slices shown (2 of 3)]
[im 1/22]
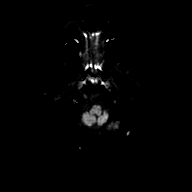
[im 11/22]
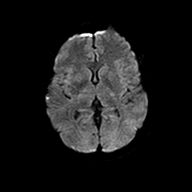
[im 22/22]
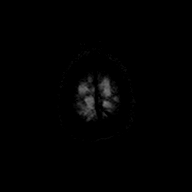

[Series 7: DWI · axial · 5.0mm · 1.35mm/px · z∈[-40,+86]mm · 3 of 20 slices shown (3 of 3)]
[im 1/20]
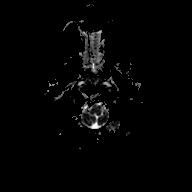
[im 10/20]
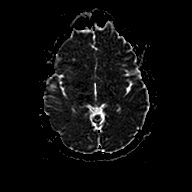
[im 20/20]
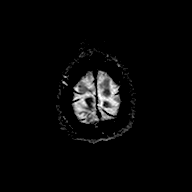

[Series 8: FLAIR · sagittal · 4.0mm · 0.75mm/px · 4 of 26 slices shown (1 of 2)]
[im 1/26]
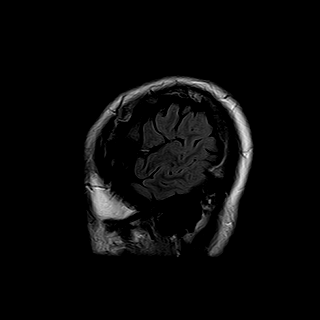
[im 9/26]
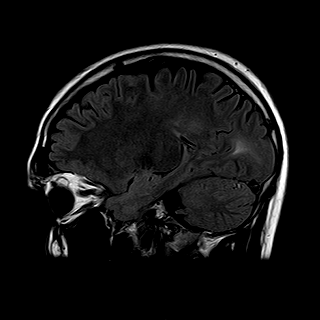
[im 17/26]
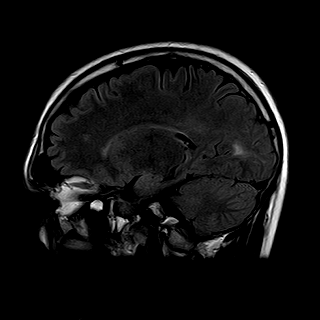
[im 26/26]
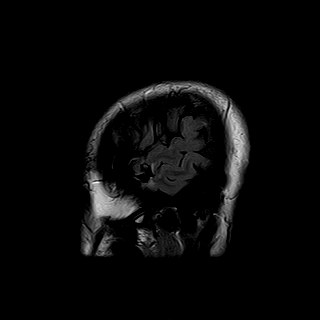

[Series 9: FLAIR · axial · 5.0mm · 0.76mm/px · z∈[-42,+84]mm · 3 of 22 slices shown (2 of 2)]
[im 1/22]
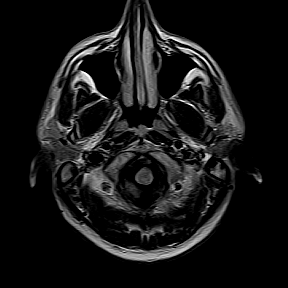
[im 11/22]
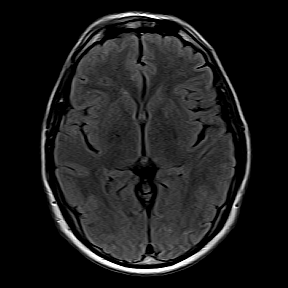
[im 22/22]
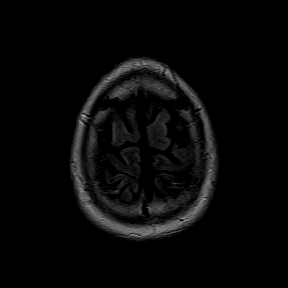

[Series 10: T1 · axial · 5.0mm · 0.69mm/px · z∈[-42,+84]mm · 3 of 22 slices shown (1 of 2)]
[im 1/22]
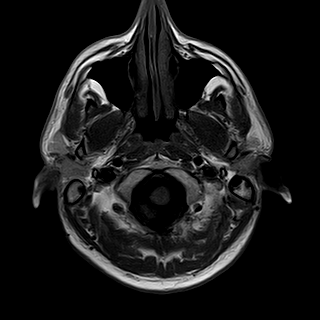
[im 11/22]
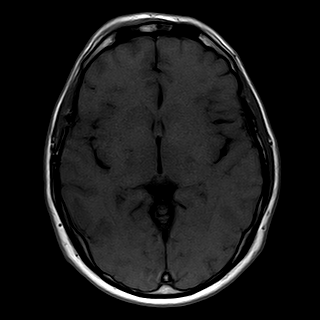
[im 22/22]
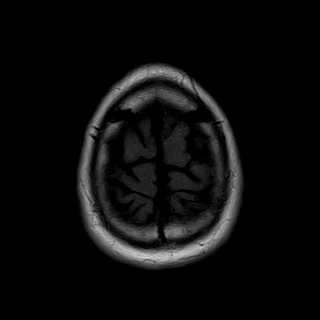

[Series 12: T2 · coronal · 6.0mm · 0.43mm/px · 3 of 24 slices shown]
[im 1/24]
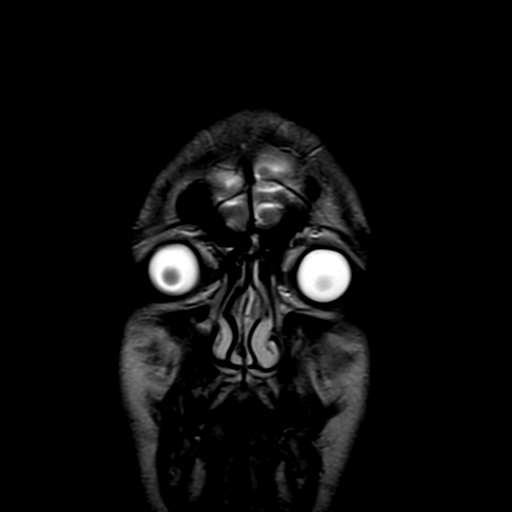
[im 12/24]
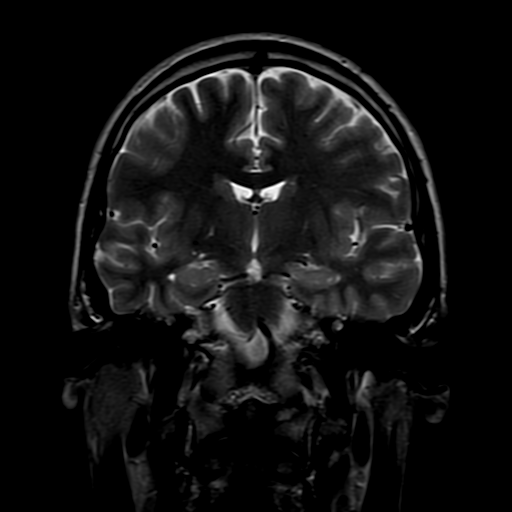
[im 24/24]
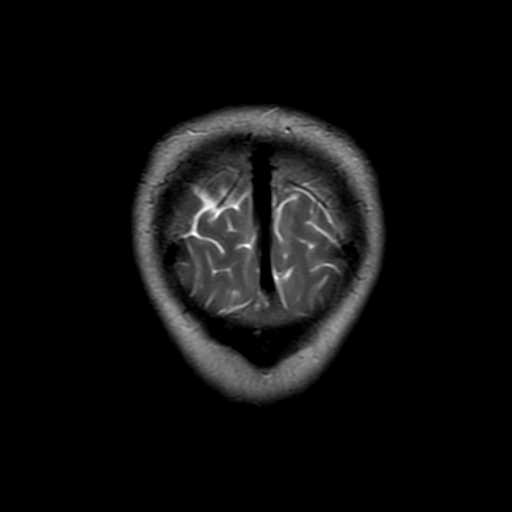

[Series 13: T1 · coronal · 3.5mm · 0.47mm/px · 2 of 16 slices shown (2 of 2)]
[im 1/16]
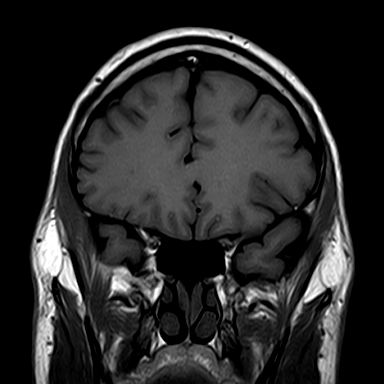
[im 16/16]
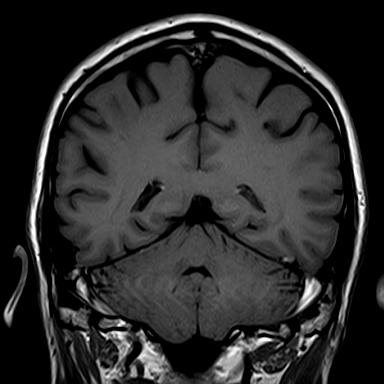

[Series 14: T1 post-contrast · axial · 5.0mm · 0.43mm/px · z∈[-51,+93]mm · 4 of 25 slices shown (1 of 2)]
[im 1/25]
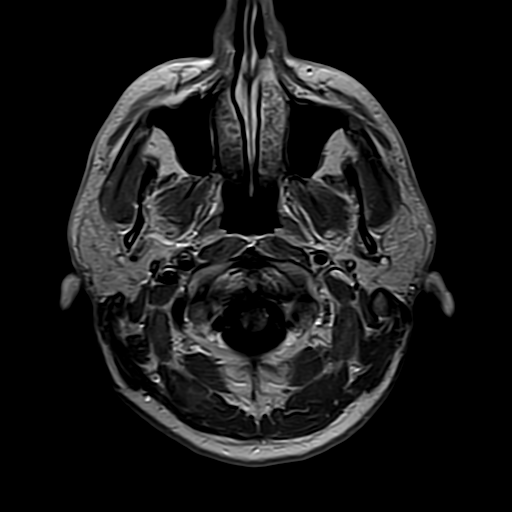
[im 9/25]
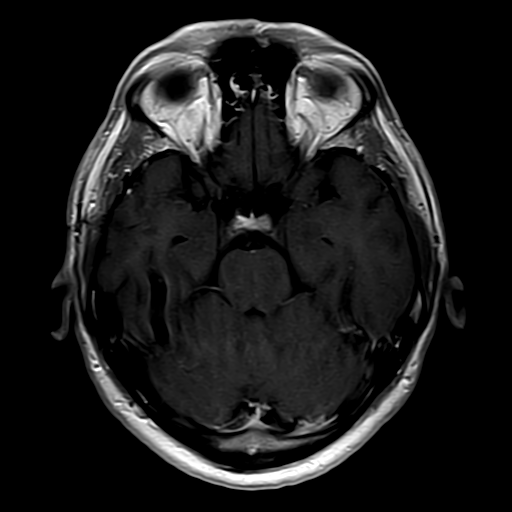
[im 17/25]
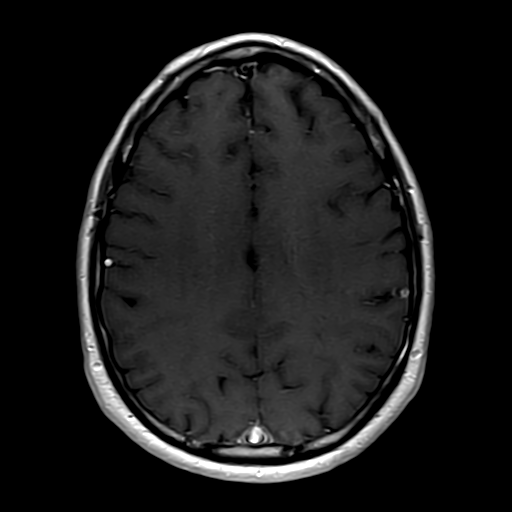
[im 25/25]
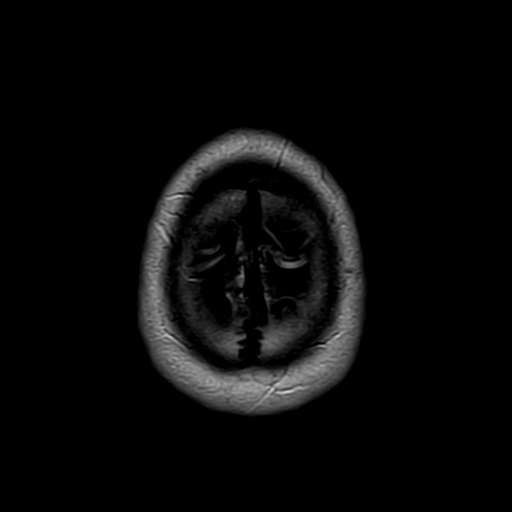

[Series 15: T1 fat-sat post-contrast · coronal · 5.0mm · 0.57mm/px · 1 of 24 slices shown]
[im 1/24]
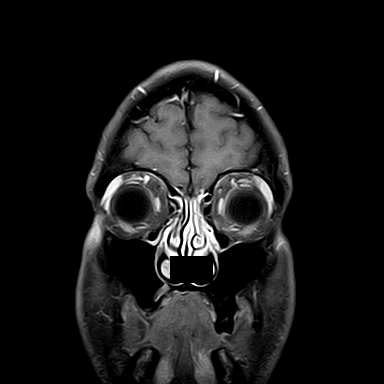

[Series 16: T1 post-contrast · sagittal · 5.0mm · 0.43mm/px · 4 of 25 slices shown (2 of 2)]
[im 1/25]
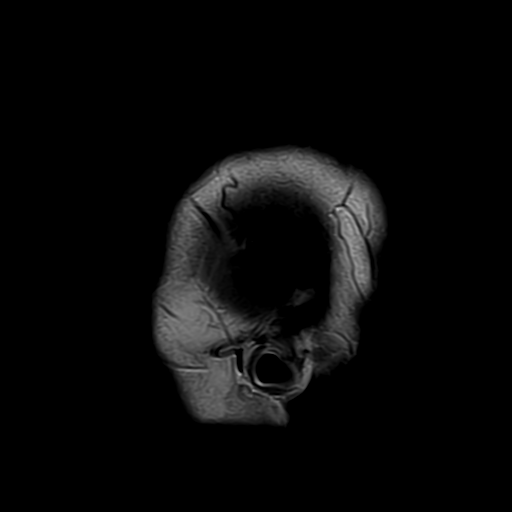
[im 9/25]
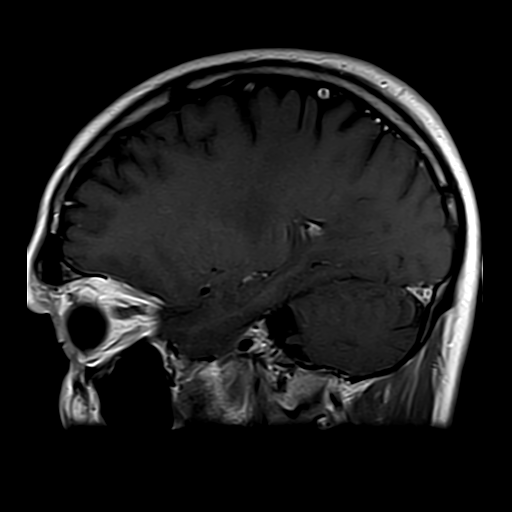
[im 17/25]
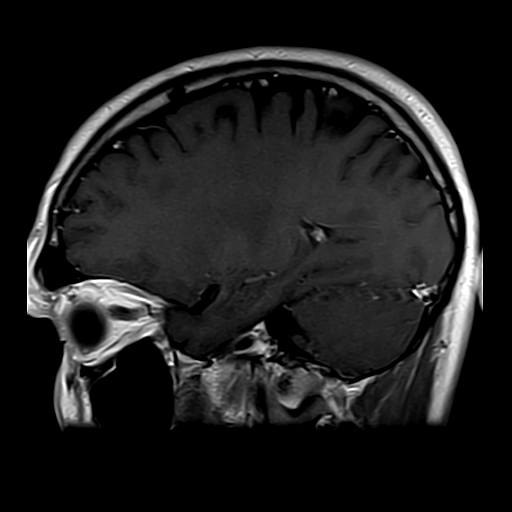
[im 25/25]
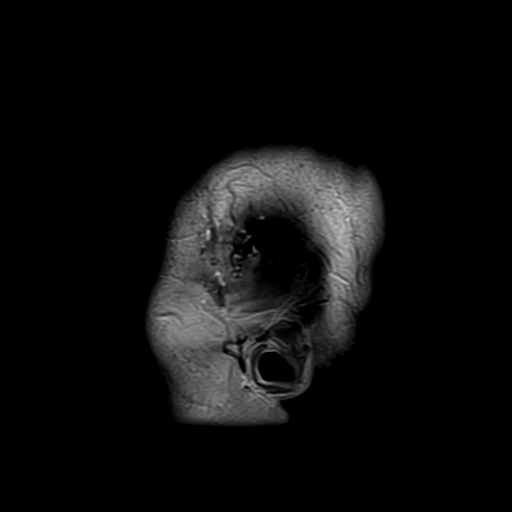

[39 of 48 positions shown; findings below may reference images not displayed]

FINDINGS: Postsurgical changes are noted with right temporal craniotomy and a small focus of encephalomalacia in the adjacent right temporal bone.  There is no abnormal enhancement in this region to suggest residual or recurrent tumor.  

This examination is otherwise unremarkable.  There is no evidence of mass effect, acute hemorrhage, shift of the midline structures, or extra-axial collection.  The ventricles and CSF spaces appear within normal limits.  There is no significant white matter disease. 

The paranasal sinuses and mastoid air cells appear clear.
IMPRESSION: 1. Postsurgical changes.

2. No recurrent or residual tumor is seen.

## 2022-11-20 ENCOUNTER — Telehealth (INDEPENDENT_AMBULATORY_CARE_PROVIDER_SITE_OTHER): Payer: Self-pay | Admitting: NEUROLOGY

## 2022-11-20 NOTE — Telephone Encounter (Signed)
Patient Called and asked if we would put in a referral to Neurosurgery at [facility] for him. He said that the drive is just to far to [facility] and that he doesn't like it up there. I said I would ask you but that even if we do put one in it is going to be a while before he gets in. I told him I would recommend continuing his care at [facility] until he gets an appt. At [facility].

## 2022-11-25 NOTE — Telephone Encounter (Signed)
Called patient and told him to get the referral from Neurosurgery and he said he will do that.

## 2022-12-11 ENCOUNTER — Encounter (INDEPENDENT_AMBULATORY_CARE_PROVIDER_SITE_OTHER): Payer: Self-pay | Admitting: NEUROLOGY

## 2022-12-11 ENCOUNTER — Other Ambulatory Visit: Payer: Self-pay

## 2022-12-11 ENCOUNTER — Ambulatory Visit (INDEPENDENT_AMBULATORY_CARE_PROVIDER_SITE_OTHER): Payer: MEDICAID | Admitting: NEUROLOGY

## 2022-12-11 VITALS — BP 121/68 | HR 76 | Temp 98.5°F | Wt 178.0 lb

## 2022-12-11 DIAGNOSIS — G43909 Migraine, unspecified, not intractable, without status migrainosus: Secondary | ICD-10-CM

## 2022-12-11 DIAGNOSIS — G9389 Other specified disorders of brain: Secondary | ICD-10-CM

## 2022-12-11 DIAGNOSIS — G40909 Epilepsy, unspecified, not intractable, without status epilepticus: Secondary | ICD-10-CM

## 2022-12-11 MED ORDER — CARBAMAZEPINE 100 MG CHEWABLE TABLET
400.0000 mg | CHEWABLE_TABLET | Freq: Two times a day (BID) | ORAL | 1 refills | Status: AC
Start: 2022-12-11 — End: 2023-06-09

## 2022-12-11 MED ORDER — PROPRANOLOL ER 60 MG CAPSULE,24 HR,EXTENDED RELEASE
60.0000 mg | ORAL_CAPSULE | Freq: Every day | ORAL | 0 refills | Status: DC
Start: 2022-12-11 — End: 2024-02-22

## 2022-12-11 NOTE — Progress Notes (Signed)
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 Neurologic exam is nonfocal. Since last visit, he had an episode of status epilepticus in the setting of medication noncompliance. He was admitted to [facility]. He fortunately has been seizure free since this event solely on Tegretol 400mg  BID. He believes that the chewable tabs he was given at some point were more beneficial than regular tablets so we will try to obtain these for him. He is no longer on Briviact and reports that he discontinued this not long after starting it. He is also on Klonopin 1mg  BID prescribed by psychiatry which is likely providing additional protection. Unfortunately, I think his psychiatric issues tend to be the reason for eventual medication noncompliance. He has had two psychiatric admissions for psychosis since last seen.   2.   GLIONEURONAL TUMOR: He was discovered to have a low-grade glioneuronal tumor confirmed by pathology s/p resection August 18th, 2023. It's possible that this represents a DNET given the presentation. It was felt less likely to represent a ganglioglioma given BRAF negativity. He has been following with NSGY at [facility] but may switch to [facility] for convenience.  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 We will trial low dose propranolol to see if we can achieve any reduction in frequency. He is overusing analgesics as well.     PLAN  Continue Tegretol 400mg  BID        Counseled on importance of compliance with Klonopin as well        Patient not driving, aware of [place] and [place] state driving laws        Patient aware of SUDEP  2.   Continue f/u with NSGY  3.   Trial propranolol 60 mg LA daily        Counseled on medication overuse headache        Encouraged aggressive hydration    RTC 3-4 months    Thank you for allowing me to participate in  your patient's care and please do not hesitate to contact me for any questions or concerns.    Patrick North, DO  Assistant Professor of Neurology  [facility]     [phone]: I will continue to be the provider focal point in managing the chronic complex neurological condition  ==========================================================================================================================================    NAME:  Cyprian Naasz  DOB:  [birth year]  VISIT DATE:  05/05/2022    CC:  Epilepsy    Patient seen in consultation at the request of Althea Grimmer, FNP  History obtained from the patient and chart/records  Age of patient:  32 y.o.    INTERVAL: Since last visit, he had an episode of status epilepticus in setting of medication noncompliance. He was treated at [facility]. Medications were not changed. He has also been admitted twice for psychosis since last visit and a benzodiazepine was started. He has been seizure free since April. He is also bothered by headaches ever since his tumor resection. He is using analgesics daily    HPI:   I had the pleasure of seeing your patient in neurology clinic for an outpatient consultation, who is a 32 y.o. year old male who was referred for evaluation of epilepsy.  Please allow me to summarize the history for the record.    He states that seizures began  at age 24 and were characterized by abrupt loss of consciousness "falling out." He was first placed on Keppra. He is not quite sure why it was stopped but thinks it was mood side effects.He has since been on monotherapy with Tegretol.  He reports that in 2010 his seizures began to change into more frank GTC events. A right anterior temporal lobe mass was discovered recently, and in August of this year, he underwent resection in [place]. He tolerated the procedure well and post-op images appeared as expected.  Since resection, if anything seizures have worsened. He will have multiple GTCs per  week. He knows there is convulsive activity, and it sounds like there is focal movement of the left side but he doesn't have a great description of the events. They are characterized by tongue biting. He is post-ictal typically until the following day. Vimpat was added to therapy by his previous neurologist Dr. Daisey Must, however, he discontinued this due to some discussion with a pharmacist and the fact that he felt it wasn't helping.    ============================================================================================================================================  PMHx  Patient Active Problem List   Diagnosis    Acute appendicitis    Attention deficit hyperactivity disorder (ADHD)    History of substance abuse (CMS HCC)    Seizure (CMS HCC)    Viral hepatitis C    Vitamin D deficiency    R temporal brain lesion s/p crani for resection    Breakthrough seizure (CMS HCC)     Past Surgical History:   Procedure Laterality Date    BRAIN SURGERY Right     EYE SURGERY Left     HX APPENDECTOMY      HX OTHER      calcium deposit removed from above eye as a teen         Family Medical History:       Problem Relation (Age of Onset)    Cancer Father, Paternal Grandfather, Maternal Aunt    Healthy Mother    Multiple Sclerosis Mother            Current Outpatient Medications   Medication Sig Dispense Refill    acetaminophen (TYLENOL ARTHRITIS ORAL) Take by mouth As needed      atomoxetine (STRATTERA) 25 mg Oral Capsule Take 1 Capsule (25 mg total) by mouth Once a day      brivaracetam (BRIVIACT) 50 mg Oral Tablet Take 1 Tablet (50 mg total) by mouth Twice daily for 180 days 180 Tablet 1    carBAMazepine (TEGRETOL  XR) 200 mg Oral Tablet Sustained Release 12 hr Take 2 Tablets (400 mg total) by mouth Twice daily for 180 days 360 Tablet 1    clonazePAM (KLONOPIN) 1 mg Oral Tablet Take 1 Tablet (1 mg total) by mouth Three times a day      IBUPROFEN ORAL Take by mouth As needed      OLANZapine (ZYPREXA) 10 mg Oral Tablet Take  1 Tablet (10 mg total) by mouth Every night      pantoprazole (PROTONIX) 40 mg Oral Tablet, Delayed Release (E.C.) Take 1 Tablet (40 mg total) by mouth Every morning before breakfast      traZODone (DESYREL) 50 mg Oral Tablet Take 1 Tablet (50 mg total) by mouth Once a day      UZEDY 200 mg/0.56 mL Subcutaneous suspension,extended rel syring SQ injection Inject 0.56 mL (200 mg total) under the skin Every 2 months       No current facility-administered medications for this visit.  Allergies   Allergen Reactions    Bee Venom Protein (Honey Bee)  Other Adverse Reaction (Add comment)     Social History     Socioeconomic History    Marital status: Married     Spouse name: Not on file    Number of children: 0    Years of education: 12    Highest education level: Not on file   Occupational History    Occupation: not employed: formerly worked at [employer]   Tobacco Use    Smoking status: Every Day     Current packs/day: 1.00     Average packs/day: 1 pack/day for 10.0 years (10.0 ttl pk-yrs)     Types: Cigarettes    Smokeless tobacco: Current    Tobacco comments:     1-2 day    Vaping Use    Vaping status: Not on file   Substance and Sexual Activity    Alcohol use: Not Currently     Comment: occas    Drug use: Not Currently     Types: IV, Marijuana     Comment: clean for age 82    Sexual activity: Not on file   Other Topics Concern    Ability to Walk 1 Flight of Steps without SOB/CP Yes    Routine Exercise Yes     Comment: strength training    Ability to Walk 2 Flight of Steps without SOB/CP Yes    Unable to Ambulate Not Asked    Total Care Not Asked    Ability To Do Own ADL's Yes    Uses Walker Not Asked    Other Activity Level Yes     Comment: mows grass , walks , can do routine stuff around the house    Uses Cane Not Asked   Social History Narrative    Right handed.      Social Determinants of Psychologist, prison and probation services Strain: Not on file   Transportation Needs: Not on file   Social Connections: Not on file   Intimate  Partner Violence: Not on file   Housing Stability: Not on file       ============================================================================================================================================  GENERAL EXAMINATION  BP 121/68 (Site: Left Arm, Patient Position: Sitting, Cuff Size: Adult)   Pulse 76   Temp 36.9 C (98.5 F) (Temporal)   Wt 80.7 kg (178 lb)   SpO2 96%   BMI 25.54 kg/m     Vital signs personally reviewed  General: No acute distress, alert  HEENT: Normocephalic, no scleral icterus  Extremities: No significant edema, No cyanosis    NEUROLOGIC EXAM  On neurological exam, patient was awake, alert and answering questions appropriately  Speech was fluent, without dysarthria or aphasia.    CN  II-XII: grossly intact    MOTOR  Bulk: normal  Abnormal Movements: none    Strength:     MRC Grading Scale   Right Left   Deltoid 5 5   Biceps 5 5   Triceps 5 5   Wrist Extension - -   Wrist Flexion - -   Finger Extension - -   Finger Abduction - -   Finger Flexion - -   Hip Flexion 5 5   Hip Extension - -   Hip Abduction - -   Hip Adduction - -   Knee Extension 5 5   Knee Flexion 5 5   Ankle Dorsiflexion - -   Ankle Plantarflexion - -   Toe Extension - -  Toe Flexion - -     REFLEXES   Right Left   Biceps 2 2   Triceps 2 2   Brachioradialis 2 2   Patellar 2 2   Achilles 2 2   Plantar - -   Hoffman - -   Pectoralis - -   Jaw Jerk - -       SENSORY  Light touch: intact throughout    GAIT  General: casual, normal gait    ================================================================================================================================LABS  Personal Review of prior labs is notable for:   2024  CBC WNL   CMP largely WNL   Tegretol level 4.5  2023  CBC normal   TSH normal   CMP largely within normal limits  IMAGING  Personal Review of imaging is notable for:   MRI of the brain with and without contrast November 13, 2021 small lesion apparent on T2 weighted imaging in the anterior right temporal  lobe no abnormal enhancement      OTHER DIAGNOSTICS  Personal Review of other prior diagnostics is notable for:    Surgical pathology December 12, 2021  Final Diagnosis   A. Brain, right temporal brain tumor, resection:  - Consistent with low-grade glioneuronal tumor (see comment)     B. Brain, right temporal brain tumor, resection:  - Consistent with low-grade glioneuronal tumor (see comment)    Electronically signed by Genella Rife, MD on 12/24/2021 at [phone]   Diagnosis Comment    The specimen shows slightly hypercellular brain parenchyma with clusters of neuronal cells. GFAP stain highlights glial cell population; mostly reactive pattern. NeuN stain shows weak reactivity in a subset of neuron-like cells, suggestive of dysplastic neurons. These abnormal neuronal cells appear to be clustered in higher density. Synaptophysin shows dot-like reactivity (synapses) but lack of cytoplasmic staining. IDH1 (R132H) is negative (wild type). CD34 highlights vessel only and lack of ramified cells. Ki67 proliferation is low, highlighting only occasional nuclei (<1%). BRAF (V600E) immunostain is negative.      Given the abnormal neuronal population and in consideration of MRI findings, this lesion is mostly consistent with a low-grade glioneuronal tumor. Negative BRAF (V600E) argues against, although not exclude, the possibility of ganglioglioma. Cortical dysplasia can not be reliably assessed in this fragmented specimen. No cystic lesion is identified microscopically.

## 2022-12-21 NOTE — Progress Notes (Deleted)
NEUROSURGERY, PHYSICIAN OFFICE CENTER  [address]  [place] [address]  Operated by [facility]  Progress Note    Name: [name] [name] MRN:  [id number]   Date: 12/24/2022 DOB:  [birth year] (32 y.o.)           Referring Provider:   Keenan Bachelor, MD  [address]  [address]  [place],  [place] [address]          Subjective:   Kayvin Moody is a 32 year old right handed male who is s/p right temporal craniotomy for tumor resection by Dr. Mayford Knife on 12/12/21 (pathology: low grade glioneuronal tumor).     Objective:   Vital Signs:  There were no vitals taken for this visit.      Constitutional  General appearance: {NORMAL/ABNORMAL:20180}  Eyes: Ophthalmic exam of optic discs and posterior segments: {NORMAL/ABNORMAL:20180}  Cardiovascular:   Carotid arteries: {NORMAL/ABNORMAL:20180}  HEENT:    HEENT:  {NORMAL/ABNORMAL:20180}  Musculoskeletal  Gait and Station: : {NORMAL/ABNORMAL:20180}  Muscle strength (upper extremities): : {NORMAL/ABNORMAL:20180}  Muscle strength (lower extremities): : {NORMAL/ABNORMAL:20180}  Muscle tone (upper extremities): : {NORMAL/ABNORMAL:20180}  Muscle tone (lower extremities): : {NORMAL/ABNORMAL:20180}  Sensation: {NORMAL/ABNORMAL:20180}  Coordination: {NORMAL/ABNORMAL:20180}  Hoffman's reflex: Left: {Pos/neg/not tested:13089} Right: {Pos/neg/not tested:13089}  Ankle clonus:Left : {ANKLE CLONUS:20024204} Right {ANKLE CLONUS:20024204}  Babinski: Left: {PRESENT/ABSENT:20119} Right: {PRESENT/ABSENT:20119}  Neurological  Orientation: {NORMAL/ABNORMAL:20180}  Recent and remote memory: {NORMAL/ABNORMAL:20180}  Attention span and concentration: {NORMAL/ABNORMAL:20180}  Language: {NORMAL/ABNORMAL:20180}  Fund of knowledge: {NORMAL/ABNORMAL:20180}    Incisions {Wound description:20548}    Cranial Nerves  2nd: {NORMAL/ABNORMAL:20180}  3rd,4th,6th: {NORMAL/ABNORMAL:20180}  5th: {NORMAL/ABNORMAL:20180}  7th: {NORMAL/ABNORMAL:20180}  8th: {NORMAL/ABNORMAL:20180}  9th:  {NORMAL/ABNORMAL:20180}  11th: {NORMAL/ABNORMAL:20180}  12th: {NORMAL/ABNORMAL:20180}    Data reviewed  12/24/22 MRI brain w/wo [facility] PACS:    Discussions with other providers:   Reviewed chart notes     Assessment:    Assessment/Plan   1. Brain tumor (benign) (CMS HCC)        Recommendations:    Terrilyn Saver, APRN,FNP-BC

## 2022-12-22 ENCOUNTER — Telehealth (INDEPENDENT_AMBULATORY_CARE_PROVIDER_SITE_OTHER): Payer: Self-pay | Admitting: Neurology

## 2022-12-22 NOTE — Telephone Encounter (Signed)
Copied from CRM [id number]. Topic: Clinical - Question  >> Dec 22, 2022  8:50 AM Wende Bushy wrote:  Maximino Greenland called with a clinical question. Ethon had an MRI done on 08/24/22 @ [facility] in [place], [place]  Copy of the report was scanned into media on 09/09/22   Mitzi Davenport wants to know why Hylan needs to have another MRI   Please call [place]

## 2022-12-24 ENCOUNTER — Ambulatory Visit (INDEPENDENT_AMBULATORY_CARE_PROVIDER_SITE_OTHER): Payer: Self-pay

## 2022-12-24 ENCOUNTER — Ambulatory Visit (INDEPENDENT_AMBULATORY_CARE_PROVIDER_SITE_OTHER): Payer: Self-pay | Admitting: NURSE PRACTITIONER

## 2022-12-24 DIAGNOSIS — D332 Benign neoplasm of brain, unspecified: Secondary | ICD-10-CM

## 2022-12-24 NOTE — Progress Notes (Signed)
Spoke with patient's wife. Patient had an MRI brain late April after hospitalization and is following with local neurologist. No need for repeat MRI at [facility].     Georgina Peer, MD 12/24/2022, 11:20

## 2023-02-11 ENCOUNTER — Encounter (HOSPITAL_BASED_OUTPATIENT_CLINIC_OR_DEPARTMENT_OTHER): Payer: Self-pay

## 2023-02-11 ENCOUNTER — Emergency Department (HOSPITAL_BASED_OUTPATIENT_CLINIC_OR_DEPARTMENT_OTHER): Payer: MEDICAID

## 2023-02-11 ENCOUNTER — Emergency Department
Admission: EM | Admit: 2023-02-11 | Discharge: 2023-02-11 | Disposition: A | Discharge status: Home / Self Care | Payer: MEDICAID | Attending: Emergency Medicine | Admitting: Emergency Medicine

## 2023-02-11 ENCOUNTER — Other Ambulatory Visit: Payer: Self-pay

## 2023-02-11 DIAGNOSIS — S40012A Contusion of left shoulder, initial encounter: Secondary | ICD-10-CM | POA: Insufficient documentation

## 2023-02-11 DIAGNOSIS — S161XXA Strain of muscle, fascia and tendon at neck level, initial encounter: Secondary | ICD-10-CM | POA: Insufficient documentation

## 2023-02-11 DIAGNOSIS — M62838 Other muscle spasm: Secondary | ICD-10-CM | POA: Insufficient documentation

## 2023-02-11 DIAGNOSIS — S40029A Contusion of unspecified upper arm, initial encounter: Secondary | ICD-10-CM

## 2023-02-11 DIAGNOSIS — W19XXXA Unspecified fall, initial encounter: Secondary | ICD-10-CM

## 2023-02-11 DIAGNOSIS — X58XXXA Exposure to other specified factors, initial encounter: Secondary | ICD-10-CM | POA: Insufficient documentation

## 2023-02-11 DIAGNOSIS — R569 Unspecified convulsions: Secondary | ICD-10-CM | POA: Insufficient documentation

## 2023-02-11 MED ORDER — KETOROLAC 60 MG/2 ML INTRAMUSCULAR SOLUTION
INTRAMUSCULAR | Status: AC
Start: 2023-02-11 — End: 2023-02-11
  Filled 2023-02-11: qty 2

## 2023-02-11 MED ORDER — CYCLOBENZAPRINE 10 MG TABLET
10.0000 mg | ORAL_TABLET | Freq: Three times a day (TID) | ORAL | 0 refills | Status: DC | PRN
Start: 2023-02-11 — End: 2024-02-22

## 2023-02-11 MED ORDER — CYCLOBENZAPRINE 10 MG TABLET
10.0000 mg | ORAL_TABLET | ORAL | Status: AC
Start: 2023-02-11 — End: 2023-02-11
  Administered 2023-02-11: 10 mg via ORAL

## 2023-02-11 MED ORDER — CYCLOBENZAPRINE 10 MG TABLET
ORAL_TABLET | ORAL | Status: AC
Start: 2023-02-11 — End: 2023-02-11
  Filled 2023-02-11: qty 1

## 2023-02-11 MED ORDER — METHYLPREDNISOLONE ACETATE 80 MG/ML SUSPENSION FOR INJECTION
80.0000 mg | Freq: Once | INTRAMUSCULAR | Status: AC
Start: 2023-02-11 — End: 2023-02-11
  Administered 2023-02-11: 80 mg via INTRAMUSCULAR

## 2023-02-11 MED ORDER — KETOROLAC 10 MG TABLET
10.0000 mg | ORAL_TABLET | Freq: Four times a day (QID) | ORAL | 0 refills | Status: DC | PRN
Start: 2023-02-11 — End: 2024-02-22

## 2023-02-11 MED ORDER — METHYLPREDNISOLONE ACETATE 80 MG/ML SUSPENSION FOR INJECTION
INTRAMUSCULAR | Status: AC
Start: 2023-02-11 — End: 2023-02-11
  Filled 2023-02-11: qty 1

## 2023-02-11 MED ORDER — KETOROLAC 60 MG/2 ML INTRAMUSCULAR SOLUTION
60.0000 mg | INTRAMUSCULAR | Status: AC
Start: 2023-02-11 — End: 2023-02-11
  Administered 2023-02-11: 60 mg via INTRAMUSCULAR

## 2023-02-11 NOTE — ED Triage Notes (Signed)
Patient states he has back pain/shoulder pain radiating down his right arm. Has left arm in a sling at time of triage. Fell approx 3 days ago d/t seizure and pain has gotten worse since. States he has numbness up his left arm.

## 2023-02-11 NOTE — ED Provider Notes (Signed)
[facility], [facility] - Emergency Department  ED Primary Provider Note  History of Present Illness   Chief Complaint   Patient presents with    Arm Injury     [name] [name] is a 32 y.o. male who had concerns including Arm Injury.  Arrival: The patient arrived by Car complaining of left side of his neck hurting radiating to his shoulder in down his upper back.  He is also complaining of numbness and tingling in the 2nd and 3rd fingers on the left side.  He is complaining of pain to his left humeral area.  Patient denies any weakness to the arm.  States having a seizure and unaware what he hit.    HPI  Review of Systems   Review of Systems   Constitutional:  Positive for activity change and appetite change. Negative for chills and fever.   HENT:  Negative for ear pain and sore throat.    Eyes:  Negative for pain and visual disturbance.   Respiratory:  Negative for cough and shortness of breath.    Cardiovascular:  Negative for chest pain and palpitations.   Gastrointestinal:  Negative for abdominal pain and vomiting.   Genitourinary:  Negative for dysuria and hematuria.   Musculoskeletal:  Positive for arthralgias, back pain and myalgias.   Skin:  Negative for color change and rash.   Neurological:  Negative for seizures and syncope.   All other systems reviewed and are negative.     Historical Data   History Reviewed This Encounter:     Physical Exam   ED Triage Vitals [02/11/23 1341]   BP (Non-Invasive) (!) 142/106   Heart Rate (!) 114   Respiratory Rate 20   Temperature 37.1 C (98.8 F)   SpO2 96 %   Weight 83.9 kg (185 lb)   Height 1.854 m (6\' 1" )     Physical Exam  Vitals and nursing note reviewed.   Constitutional:       General: He is not in acute distress.     Appearance: Normal appearance. He is well-developed and normal weight.   HENT:      Head: Normocephalic and atraumatic.      Right Ear: External ear normal.      Left Ear: External ear normal.      Nose: Nose normal.       Mouth/Throat:      Mouth: Mucous membranes are dry.   Eyes:      Extraocular Movements: Extraocular movements intact.      Conjunctiva/sclera: Conjunctivae normal.      Pupils: Pupils are equal, round, and reactive to light.   Neck:      Comments: Positive tenderness left C5-C6 area.  Cardiovascular:      Rate and Rhythm: Normal rate and regular rhythm.      Pulses: Normal pulses.      Heart sounds: Normal heart sounds. No murmur heard.  Pulmonary:      Effort: Pulmonary effort is normal. No respiratory distress.      Breath sounds: Normal breath sounds.   Abdominal:      General: Bowel sounds are normal.      Palpations: Abdomen is soft.      Tenderness: There is no abdominal tenderness.   Musculoskeletal:         General: Tenderness present. No swelling.      Cervical back: Neck supple. Tenderness present.      Comments: Positive tenderness over the left humeral area.  No swelling or ecchymosis.  No crepitus or deformity.   Skin:     General: Skin is warm and dry.      Capillary Refill: Capillary refill takes less than 2 seconds.   Neurological:      General: No focal deficit present.      Mental Status: He is alert and oriented to person, place, and time.   Psychiatric:         Mood and Affect: Mood normal.         Behavior: Behavior normal.         Thought Content: Thought content normal.       Patient Data   Labs Ordered/Reviewed - No data to display  CT CERVICAL SPINE WO IV CONTRAST   Final Result by Edi, Radresults In (10/17 1519)   NO ACUTE CERVICAL FRACTURE         One or more dose reduction techniques were used (e.g., Automated exposure control, adjustment of the mA and/or kV according to patient size, use of iterative reconstruction technique).         Radiologist location ID: [id number]         XR HUMERUS LEFT   Final Result by Edi, Radresults In (10/17 1436)   NEGATIVE HUMERUS             Radiologist location ID: [id number]           Medical Decision Making        Medical Decision Making  Patient is  59 year white male complaining having a seizure and hitting his left shoulder and back of neck.  Since then he has been having pain in the his upper back radiating into his left shoulder and down his left arm.  He is complaining of numbness tingling in the 2nd and 3rd fingers on the left arm.  He is able to move the arm with tenderness.  He is also complaining of pain in the left humerus area.  He denies any loss of consciousness.  Patient will have a CT of his neck as well as an x-ray of his humerus.  He will then be treated for results and discharged home.  He will more than likely receive steroid injection as well as pain med and muscle relaxant.  Patient will follow up with ortho in the next 2-3 days.    Amount and/or Complexity of Data Reviewed  Radiology: ordered.    Risk  Prescription drug management.             Medications Administered in the ED   ketorolac (TORADOL) 60mg /2 mL IM injection (has no administration in time range)   cyclobenzaprine (FLEXERIL) tablet (has no administration in time range)   methylPREDNISolone acetate (DEPO-medrol) 80 mg/mL injection (has no administration in time range)     Clinical Impression   Cervical strain, acute (Primary)   Muscle spasm   Contusion, shoulder and upper arm, multiple sites       Disposition: Discharged               Clinical Impression   Cervical strain, acute (Primary)   Muscle spasm   Contusion, shoulder and upper arm, multiple sites       Current Discharge Medication List        START taking these medications    Details   cyclobenzaprine (FLEXERIL) 10 mg Oral Tablet Take 1 Tablet (10 mg total) by mouth Three times a day as needed for Muscle spasms  Qty:  12 Tablet, Refills: 0      ketorolac tromethamine (TORADOL) 10 mg Oral Tablet Take 1 Tablet (10 mg total) by mouth Every 6 hours as needed for Pain  Qty: 20 Tablet, Refills: 0

## 2023-02-11 NOTE — ED Notes (Signed)
[facility], [facility] - Emergency Department  Peer Recovery Coach Assessment    Initial Evaluation  Referred by:: Nurse  Location of Evaluation: Emergency Department  How many times in the last 12 months have you been to the ED?: 6 or more  Have you ever served or are you currently serving in the Armed Forces?: No             Substance Use History  Patient current substance use status: Ptient stated he smokes marijuana due to having a lot of seizures.    Prior treatment history?: No    Currently enrolled in substance use program?: No    Within the last 30 days, what substances has the patient used?: Marijuana  Patient's age at first substance use?: 15-20  Drug route of administration: Smoked    Has the patient ever had sustained abstinence?: No              Family, Social, Home & Safety History  Marital Status: Married    Number of children: 0       Need to improve relationships with family?: No    Social network: Immediate family, Non-substance using peers/friends/other    Current living situation: With family  Any help needed with the following?: None  Contact phone number for the patient: [phone]       Has the patient had any legal issues within the past 30 days?: None         Employment  Current employment status: Unemployed    Diplomatic Services operational officer?: No  Needs assistance with job search?: No    Engagement  Readiness ruler: 1  Summary of assessment priority areas: Other    Brief Intervention  Discussed plan to reduce/quit substance use?: Yes  Discussed willingness to enter treatment?: Yes  Indicated patient's stage of change:: 1 - Precontemplation    Patient seen by Peer Recovery Coach and is a candidate for buprenorphine administration in the ED. Patient needs assessment for bup treatment.: No (non opioid user)    Plan  Was the patient referred to treatment?: No    Was patient referred to physician for Buprenorphine Assessment in the ED?: No (non opioid user)    Did patient receive  Narcan in the ED?: No         Follow-up  Patient admitted for treatment?: No        Need for additional follow-up?: No  Additional comments: Safe use and long-term affects were discussed.    Haynes Bast, Peer Recovery Coach 02/11/2023 14:29

## 2023-04-15 ENCOUNTER — Encounter (INDEPENDENT_AMBULATORY_CARE_PROVIDER_SITE_OTHER): Payer: Self-pay | Admitting: NEUROLOGY

## 2023-07-20 ENCOUNTER — Encounter (INDEPENDENT_AMBULATORY_CARE_PROVIDER_SITE_OTHER): Payer: Self-pay | Admitting: NEUROLOGY

## 2024-02-22 ENCOUNTER — Emergency Department (HOSPITAL_BASED_OUTPATIENT_CLINIC_OR_DEPARTMENT_OTHER): Payer: MEDICAID

## 2024-02-22 ENCOUNTER — Emergency Department
Admission: EM | Admit: 2024-02-22 | Discharge: 2024-02-22 | Disposition: A | Discharge status: Home / Self Care | Payer: MEDICAID | Source: Home / Self Care | Attending: Emergency Medicine | Admitting: Emergency Medicine

## 2024-02-22 ENCOUNTER — Other Ambulatory Visit: Payer: Self-pay

## 2024-02-22 ENCOUNTER — Encounter (HOSPITAL_BASED_OUTPATIENT_CLINIC_OR_DEPARTMENT_OTHER): Payer: Self-pay

## 2024-02-22 DIAGNOSIS — R079 Chest pain, unspecified: Secondary | ICD-10-CM | POA: Insufficient documentation

## 2024-02-22 DIAGNOSIS — R030 Elevated blood-pressure reading, without diagnosis of hypertension: Secondary | ICD-10-CM | POA: Insufficient documentation

## 2024-02-22 DIAGNOSIS — I499 Cardiac arrhythmia, unspecified: Secondary | ICD-10-CM

## 2024-02-22 DIAGNOSIS — R52 Pain, unspecified: Secondary | ICD-10-CM

## 2024-02-22 LAB — ECG 12 LEAD
Atrial Rate: 88 {beats}/min
Calculated P Axis: 48 degrees
Calculated R Axis: 80 degrees
Calculated T Axis: 60 degrees
PR Interval: 156 ms
QRS Duration: 104 ms
QT Interval: 360 ms
QTC Calculation: 435 ms
Ventricular rate: 88 {beats}/min

## 2024-02-22 LAB — CBC WITH DIFF
BASOPHIL #: 0.03 x10ˆ3/uL (ref 0.00–0.10)
BASOPHIL %: 0 % (ref 0–1)
EOSINOPHIL #: 0.22 x10ˆ3/uL (ref 0.00–0.60)
EOSINOPHIL %: 3 % (ref 1–8)
HCT: 48.6 % — ABNORMAL HIGH (ref 36.7–47.1)
HGB: 16.7 g/dL — ABNORMAL HIGH (ref 12.5–16.3)
LYMPHOCYTE #: 2.04 x10ˆ3/uL (ref 1.00–3.00)
LYMPHOCYTE %: 25 % (ref 15–43)
MCH: 30.4 pg (ref 23.8–33.4)
MCHC: 34.4 g/dL (ref 32.5–36.3)
MCV: 88.5 fL (ref 73.0–96.2)
MONOCYTE #: 0.54 x10ˆ3/uL (ref 0.30–1.00)
MONOCYTE %: 7 % (ref 6–14)
MPV: 7.9 fL (ref 7.4–11.4)
NEUTROPHIL #: 5.47 x10ˆ3/uL (ref 1.85–7.84)
NEUTROPHIL %: 66 % (ref 44–74)
PLATELETS: 294 x10ˆ3/uL (ref 140–440)
RBC: 5.49 x10ˆ6/uL (ref 4.06–5.63)
RDW: 16 % (ref 12.1–16.2)
WBC: 8.3 x10ˆ3/uL (ref 3.6–10.2)

## 2024-02-22 LAB — COMPREHENSIVE METABOLIC PANEL, NON-FASTING
ALBUMIN/GLOBULIN RATIO: 1.2 (ref 0.8–1.4)
ALBUMIN: 4.4 g/dL (ref 3.4–5.0)
ALKALINE PHOSPHATASE: 119 U/L — ABNORMAL HIGH (ref 46–116)
ALT (SGPT): 22 U/L (ref <=78)
ANION GAP: 17 mmol/L — ABNORMAL HIGH (ref 4–13)
AST (SGOT): 18 U/L (ref 15–37)
BILIRUBIN TOTAL: 0.8 mg/dL (ref 0.2–1.0)
BUN/CREA RATIO: 11
BUN: 8 mg/dL (ref 7–18)
CALCIUM, CORRECTED: 9.6 mg/dL
CALCIUM: 9.9 mg/dL (ref 8.5–10.1)
CHLORIDE: 103 mmol/L (ref 98–107)
CO2 TOTAL: 20 mmol/L — ABNORMAL LOW (ref 21–32)
CREATININE: 0.75 mg/dL (ref 0.70–1.30)
ESTIMATED GFR: 121 mL/min/1.73mˆ2 (ref >59)
GLOBULIN: 3.7
GLUCOSE: 95 mg/dL (ref 74–106)
OSMOLALITY, CALCULATED: 278 mosm/kg (ref 270–290)
POTASSIUM: 4 mmol/L (ref 3.5–5.1)
PROTEIN TOTAL: 8.1 g/dL (ref 6.4–8.2)
SODIUM: 140 mmol/L (ref 136–145)

## 2024-02-22 LAB — COVID-19, FLU A/B, RSV RAPID BY PCR
INFLUENZA VIRUS TYPE A: NOT DETECTED
INFLUENZA VIRUS TYPE B: NOT DETECTED
RESPIRATORY SYNCTIAL VIRUS (RSV): NOT DETECTED
SARS-CoV-2: NOT DETECTED

## 2024-02-22 LAB — TROPONIN-I
TROPONIN I: <2 ng/L (ref <20)
TROPONIN I: <2 ng/L (ref <20)

## 2024-02-22 LAB — D-DIMER: D-DIMER: 246 ng{FEU}/mL (ref 215–500)

## 2024-02-22 MED ORDER — SODIUM CHLORIDE 0.9 % IV BOLUS
1000.0000 mL | INJECTION | Status: AC
Start: 2024-02-22 — End: 2024-02-22
  Administered 2024-02-22: 1000 mL via INTRAVENOUS
  Administered 2024-02-22: 0 mL via INTRAVENOUS

## 2024-02-22 MED ORDER — ASPIRIN 81 MG CHEWABLE TABLET
324.0000 mg | CHEWABLE_TABLET | ORAL | Status: AC
Start: 2024-02-22 — End: 2024-02-22
  Administered 2024-02-22: 324 mg via ORAL

## 2024-02-22 MED ORDER — HYDROXYZINE PAMOATE 25 MG CAPSULE
ORAL_CAPSULE | ORAL | Status: AC
Start: 2024-02-22 — End: 2024-02-22
  Filled 2024-02-22: qty 1

## 2024-02-22 MED ORDER — HYDROXYZINE PAMOATE 25 MG CAPSULE
25.0000 mg | ORAL_CAPSULE | ORAL | Status: AC
Start: 2024-02-22 — End: 2024-02-22
  Administered 2024-02-22: 25 mg via ORAL

## 2024-02-22 MED ORDER — ASPIRIN 81 MG CHEWABLE TABLET
CHEWABLE_TABLET | ORAL | Status: AC
Start: 2024-02-22 — End: 2024-02-22
  Filled 2024-02-22: qty 4

## 2024-02-22 MED ORDER — KETOROLAC 30 MG/ML (1 ML) INJECTION SOLUTION
15.0000 mg | INTRAMUSCULAR | Status: AC
Start: 2024-02-22 — End: 2024-02-22
  Administered 2024-02-22: 15 mg via INTRAVENOUS

## 2024-02-22 MED ORDER — KETOROLAC 30 MG/ML (1 ML) INJECTION SOLUTION
INTRAMUSCULAR | Status: AC
Start: 2024-02-22 — End: 2024-02-22
  Filled 2024-02-22: qty 1

## 2024-02-22 NOTE — ED Attending Handoff Note (Signed)
 [facility], [facility] - Emergency Department  Emergency Department  Course Note      [name] [name] is a 33 y.o. male who had concerns including Chest Pain .     I accepted this patient in transfer of care from Dr. Pleas at [phone]      I personally evaluated and examined the patient at the time of the visit.     Lab review: All labs reviewed by me at time of visit.   Results for orders placed or performed during the hospital encounter of 02/22/24 (from the past 24 hours)   COMPREHENSIVE METABOLIC PANEL, NON-FASTING    Collection Time: 02/22/24  7:39 AM   Result Value Ref Range    SODIUM 140 136 - 145 mmol/L    POTASSIUM 4.0 3.5 - 5.1 mmol/L    CHLORIDE 103 98 - 107 mmol/L    CO2 TOTAL 20 (L) 21 - 32 mmol/L    ANION GAP 17 (H) 4 - 13 mmol/L    BUN 8 7 - 18 mg/dL    CREATININE 9.24 9.29 - 1.30 mg/dL    BUN/CREA RATIO 11     ESTIMATED GFR 121 >59 mL/min/1.20m^2    ALBUMIN 4.4 3.4 - 5.0 g/dL    CALCIUM 9.9 8.5 - 89.8 mg/dL    GLUCOSE 95 74 - 893 mg/dL    ALKALINE PHOSPHATASE 119 (H) 46 - 116 U/L    ALT (SGPT) 22 <=78 U/L    AST (SGOT) 18 15 - 37 U/L    BILIRUBIN TOTAL 0.8 0.2 - 1.0 mg/dL    PROTEIN TOTAL 8.1 6.4 - 8.2 g/dL    ALBUMIN/GLOBULIN RATIO 1.2 0.8 - 1.4    OSMOLALITY, CALCULATED 278 270 - 290 mOsm/kg    CALCIUM, CORRECTED 9.6 mg/dL    GLOBULIN 3.7     Narrative    Estimated Glomerular Filtration Rate (eGFR) is calculated using the CKD-EPI (2021) equation, intended for patients 13 years of age and older. If gender is not documented or unknown, there will be no eGFR calculation.   TROPONIN-I NOW    Collection Time: 02/22/24  7:39 AM   Result Value Ref Range    TROPONIN I <2 <20 ng/L    Narrative    Values received on females ranging between 12-15 ng/L MUST include the next serial troponin to review changes in the delta differences as the reference range for the Access II chemistry analyzer is lower than the established reference range.     COVID-19, FLU A/B, RSV RAPID BY PCR    Collection Time:  02/22/24  7:44 AM   Result Value Ref Range    SARS-CoV-2 Not Detected Not Detected    INFLUENZA VIRUS TYPE A Not Detected Not Detected    INFLUENZA VIRUS TYPE B Not Detected Not Detected    RESPIRATORY SYNCTIAL VIRUS (RSV) Not Detected Not Detected    Narrative    Results are for the simultaneous qualitative identification of SARS-CoV-2 (formerly 2019-nCoV), Influenza A, Influenza B, and RSV RNA. These etiologic agents are generally detectable in nasopharyngeal and nasal swabs during the ACUTE PHASE of infection. Hence, this test is intended to be performed on respiratory specimens collected from individuals with signs and symptoms of upper respiratory tract infection who meet Centers for Disease Control and Prevention (CDC) clinical and/or epidemiological criteria for Coronavirus Disease 2019 (COVID-19) testing. CDC COVID-19 criteria for testing on human specimens is available at [facility] webpage information for Healthcare Professionals: Coronavirus Disease 2019 (COVID-19) (koshercut[employer].com.au).  False-negative results may occur if the virus has genomic mutations, insertions, deletions, or rearrangements or if performed very early in the course of illness. Otherwise, negative results indicate virus specific RNA targets are not detected, however negative results do not preclude SARS-CoV-2 infection/COVID-19, Influenza, or Respiratory syncytial virus infection. Results should not be used as the sole basis for patient management decisions. Negative results must be combined with clinical observations, patient history, and epidemiological information. If upper respiratory tract infection is still suspected based on exposure history together with other clinical findings, re-testing should be considered.    Test methodology:   Cepheid Xpert Xpress SARS-CoV-2/Flu/RSV Assay real-time polymerase chain reaction (RT-PCR) test on the GeneXpert Dx and Xpert Xpress systems.   CBC/DIFF     Collection Time: 02/22/24  8:02 AM    Narrative    The following orders were created for panel order CBC/DIFF.  Procedure                               Abnormality         Status                     ---------                               -----------         ------                     CBC WITH IPQQ[[id number]]                Abnormal            Final result                 Please view results for these tests on the individual orders.   CBC WITH DIFF    Collection Time: 02/22/24  8:02 AM   Result Value Ref Range    WBC 8.3 3.6 - 10.2 x10^3/uL    RBC 5.49 4.06 - 5.63 x10^6/uL    HGB 16.7 (H) 12.5 - 16.3 g/dL    HCT 51.3 (H) 63.2 - 47.1 %    MCV 88.5 73.0 - 96.2 fL    MCH 30.4 23.8 - 33.4 pg    MCHC 34.4 32.5 - 36.3 g/dL    RDW 83.9 87.8 - 83.7 %    PLATELETS 294 140 - 440 x10^3/uL    MPV 7.9 7.4 - 11.4 fL    NEUTROPHIL % 66 44 - 74 %    LYMPHOCYTE % 25 15 - 43 %    MONOCYTE % 7 6 - 14 %    EOSINOPHIL % 3 1 - 8 %    BASOPHIL % 0 0 - 1 %    NEUTROPHIL # 5.47 1.85 - 7.84 x10^3/uL    LYMPHOCYTE # 2.04 1.00 - 3.00 x10^3/uL    MONOCYTE # 0.54 0.30 - 1.00 x10^3/uL    EOSINOPHIL # 0.22 0.00 - 0.60 x10^3/uL    BASOPHIL # 0.03 0.00 - 0.10 x10^3/uL   TROPONIN-I IN ONE HOUR    Collection Time: 02/22/24  8:31 AM   Result Value Ref Range    TROPONIN I <2 <20 ng/L    Narrative    Values received on females ranging between 12-15 ng/L MUST include the next serial troponin to review changes in the delta  differences as the reference range for the Access II chemistry analyzer is lower than the established reference range.     D-DIMER    Collection Time: 02/22/24  8:31 AM   Result Value Ref Range    D-DIMER 246 215 - 500 ng/mL FEU    Narrative    D-Dimers are reported in FEU per ng/mL.    IF PATIENT IS EXHIBITING SYMPTOMS DVT/PE, THIS D-DIMER RESULT MAY INDICATE A NEED FOR FURTHER TESTING FOR THESE CONDITIONS. IF PATIENT IS SUSPECTED OF DIC AND SYMPTOMS WORSEN OR PERSIST, A REPEAT DIC WORKUP SHOULD BE CONSIDERED.    NOTE: ALTHOUGH THE  NORMAL RANGE FOR THIS TEST IS 215-500 ng/mL FEU, LITERATURE RECOMMENDS FURTHER TESTING FOR ANY RESULT >500ng/mL FEU.    A cut off value of 500ng/mL FEU or below can be used as an aid in the diagnosis of Thromboembolism when used in conjunction with the patient's medical history, clinical presentation and other findings. Results of 500ng/mL FEU or below have a negative predictive value of 100%.          Radiology Review: All radiologic testing viewed by me at the time of visit.   XR AP MOBILE CHEST   Final Result by Edi, Radresults In (10/28 0749)   NO ACUTE FINDINGS.            Radiologist location ID: [id number]                  Course:   Medical Decision Making  The patient is doing just fine.  He has not had any problems since I assumed care.  He does not want to wait for his 3rd troponin and wants to go home now.  I have examined him and I feel he is safe to    Amount and/or Complexity of Data Reviewed  Labs: ordered.  Radiology: ordered. Decision-making details documented in ED Course.  ECG/medicine tests: ordered and independent interpretation performed.    Risk  OTC drugs.  Prescription drug management.      ED Course as of 02/22/24 1024   Tue Feb 22, 2024   0907 TROPONIN-I: <2  Trop #1     0908 COVID-19, FLU A/B, RSV RAPID BY PCR   0908 COMPREHENSIVE METABOLIC PANEL, NON-FASTING(!)   0908 CBC/DIFF(!)   0908 D-DIMER: 246   0908 XR AP MOBILE CHEST   0946 TROPONIN-I IN ONE HOUR  Negative       Clinical Impression   Chest pain, unspecified type (Primary)   Body aches       Following the history, physical exam, and ED workup, the patient was deemed stable and suitable for discharge. The patient/caregiver was advised to return to the ED for any new or worsening symptoms. Discharge medications, and follow-up instructions were discussed with the patient/caregiver in detail, who verbalizes understanding. The patient/caregiver is in agreement and is comfortable with the plan of care.    Disposition: Discharged          Current Discharge Medication List        CONTINUE these medications - NO CHANGES were made during your visit.        Details   carBAMazepine  100 mg Tablet, Chewable  Commonly known as: TEGretol    100 mg, 4 TIMES DAILY  Refills: 0            Follow up:   Stepp, Joni M, DO  [address]  [place] [address]  [phone]    In 1 week  Cheryal Sharps, DO

## 2024-02-22 NOTE — Discharge Instructions (Signed)
 Continue your normal medications at home as prescribed by your doctor   Return to the ER for any emergencies   See your PCP within 1 week.

## 2024-02-22 NOTE — ED Provider Notes (Signed)
 [facility], [facility] - Emergency Department  ED Primary Provider Note  HPI:  [name] [name] is a 33 y.o. male     Patient complains of left-sided chest pain which has been ongoing for proximally 2 weeks.  Describes it as sharp with an associated gurgling in his stomach.  That has no exertional component that has no orthopnea chest pain is not pleuritic.  Not worsened with movement.  He has taken no medications for his symptoms.  He states he feels bad and he has felt bad for months to years.  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  He is not use drugs in over a year.  He does have hep C which has only been partially treated he has a history of seizures and in his status post removal of glial neuronal tumor.  He is not follow primary care at this time.  Full code    ROS review and negative aside from stated in HPI.    Physical Exam:  ED Triage Vitals [02/22/24 0735]   BP (Non-Invasive) (!) 151/99   Heart Rate 89   Respiratory Rate 15   Temperature 37.2 C (98.9 F)   SpO2 99 %   Weight 83.9 kg (185 lb)   Height 1.829 m (6')     No acute distress.  Patient awake alert oriented x3.  Patient appears anxious Pupils 3 mm equal round reactive.  Extraocular movements are intact.  Oropharynx is clear.  Mucous membranes moist.  Trachea midline.  Neck is supple.  Heart has regular rate and rhythm without significant murmurs rubs or gallops.  Lungs are clear to auscultation.  Abdomen soft nontender, nondistended.  Moving all extremities without difficulty.  No rash no edema.      Patient data:  Labs Ordered/Reviewed   CBC WITH DIFF - Abnormal; Notable for the following components:       Result Value    HGB 16.7 (*)     HCT 48.6 (*)     All other components within normal limits   TROPONIN-I - Normal    Narrative:     Values received on females ranging between 12-15 ng/L MUST include the next serial troponin to review changes in the delta differences as the reference  range for the Access II chemistry analyzer is lower than the established reference range.     CBC/DIFF    Narrative:     The following orders were created for panel order CBC/DIFF.  Procedure                               Abnormality         Status                     ---------                               -----------         ------                     CBC WITH IPQQ[[id number]]                Abnormal            Final result  Please view results for these tests on the individual orders.   COMPREHENSIVE METABOLIC PANEL, NON-FASTING   TROPONIN-I   TROPONIN-I   COVID-19, FLU A/B, RSV RAPID BY PCR   D-DIMER     XR AP MOBILE CHEST   Final Result by Edi, Radresults In (10/28 0749)   NO ACUTE FINDINGS.            Radiologist location ID: [id number]           EKG read by me shows a sinus rhythm with a rate of 88, normal axis, QTC is 435.  No ST no T-wave changes.    MDM:  Chest pain.  General sense of unwellness.  Broad differential includes but not limited to acute coronary syndrome PE pneumonia, viral respiratory illness, worsening hep C.  Patient has been screen for HIV in 2024.  Hypertensive 151/99 otherwise vitals are within normal limits.  EKG is nonspecific.  No acute abnormalities on chest x-ray.  Lab work CBC CMP COVID serial troponins D-dimer pending.  Patient will receive aspirin hydroxyzine  Toradol  IV fluids.  End of shift.  All pertinent details will be signed out to oncoming provider for continued management and final disposition.      Data Unavailable  Clinical Impression   Chest pain, unspecified type (Primary)   Body aches     Medications Ordered/Administered in the ED   NS bolus infusion 1,000 mL (has no administration in time range)   ketorolac  (TORADOL ) 30 mg/mL injection (has no administration in time range)   hydrOXYzine  pamoate (VISTARIL ) capsule (has no administration in time range)   aspirin chewable tablet 324 mg (324 mg Oral Given 02/22/24 0750)        Current Discharge Medication  List        CONTINUE these medications - NO CHANGES were made during your visit.        Details   carBAMazepine  100 mg Tablet, Chewable  Commonly known as: TEGretol    100 mg, 4 TIMES DAILY  Refills: 0

## 2024-02-22 NOTE — ED Nurses Note (Signed)
 Patient discharged home. Reviewed instructions with patient. Questions sufficiently answered as needed. Patient verbalized understanding of instructions. Patient encouraged to follow up with PCP as indicated. In the event of an emergency, patient instructed to call 911 or go to the nearest emergency room. Patient ambulated off the unit.

## 2024-02-22 NOTE — ED Nurses Note (Signed)
 Pt stated my aunt is on her way and I'm ready to go home. Provider notified. Orders pending.

## 2024-02-22 NOTE — ED Nurses Note (Signed)
 Pt stated used to take vistaril  but stopped going to the doctor and I no longer get it.
